# Patient Record
Sex: Female | Born: 1970 | Race: Black or African American | Hispanic: No | Marital: Single | Smoking: Current every day smoker
Health system: Southern US, Community
[De-identification: ages and names within clinical notes are randomized; demographics above are authoritative.]

## PROBLEM LIST (undated history)

## (undated) DIAGNOSIS — T7840XA Allergy, unspecified, initial encounter: Secondary | ICD-10-CM

## (undated) DIAGNOSIS — F17200 Nicotine dependence, unspecified, uncomplicated: Secondary | ICD-10-CM

## (undated) DIAGNOSIS — Z90710 Acquired absence of both cervix and uterus: Secondary | ICD-10-CM

## (undated) DIAGNOSIS — I10 Essential (primary) hypertension: Secondary | ICD-10-CM

## (undated) DIAGNOSIS — R569 Unspecified convulsions: Secondary | ICD-10-CM

## (undated) DIAGNOSIS — D649 Anemia, unspecified: Secondary | ICD-10-CM

## (undated) DIAGNOSIS — E119 Type 2 diabetes mellitus without complications: Secondary | ICD-10-CM

## (undated) HISTORY — DX: Essential (primary) hypertension: I10

## (undated) HISTORY — DX: Acquired absence of both cervix and uterus: Z90.710

## (undated) HISTORY — DX: Unspecified convulsions: R56.9

## (undated) HISTORY — DX: Allergy, unspecified, initial encounter: T78.40XA

## (undated) HISTORY — PX: ABDOMINAL HYSTERECTOMY: SHX81

## (undated) HISTORY — DX: Type 2 diabetes mellitus without complications: E11.9

## (undated) HISTORY — DX: Anemia, unspecified: D64.9

## (undated) HISTORY — DX: Nicotine dependence, unspecified, uncomplicated: F17.200

---

## 1997-10-14 ENCOUNTER — Emergency Department (HOSPITAL_COMMUNITY): Admission: EM | Admit: 1997-10-14 | Discharge: 1997-10-14 | Payer: Self-pay | Admitting: Emergency Medicine

## 1998-10-10 ENCOUNTER — Inpatient Hospital Stay (HOSPITAL_COMMUNITY): Admission: AD | Admit: 1998-10-10 | Discharge: 1998-10-10 | Payer: Self-pay | Admitting: Obstetrics

## 1999-01-23 ENCOUNTER — Emergency Department (HOSPITAL_COMMUNITY): Admission: EM | Admit: 1999-01-23 | Discharge: 1999-01-23 | Payer: Self-pay | Admitting: Emergency Medicine

## 2000-01-07 ENCOUNTER — Emergency Department (HOSPITAL_COMMUNITY): Admission: EM | Admit: 2000-01-07 | Discharge: 2000-01-07 | Payer: Self-pay | Admitting: Emergency Medicine

## 2000-01-10 ENCOUNTER — Encounter (INDEPENDENT_AMBULATORY_CARE_PROVIDER_SITE_OTHER): Payer: Self-pay | Admitting: Specialist

## 2000-01-10 ENCOUNTER — Ambulatory Visit (HOSPITAL_COMMUNITY): Admission: RE | Admit: 2000-01-10 | Discharge: 2000-01-10 | Payer: Self-pay | Admitting: General Surgery

## 2002-01-27 ENCOUNTER — Emergency Department (HOSPITAL_COMMUNITY): Admission: EM | Admit: 2002-01-27 | Discharge: 2002-01-27 | Payer: Self-pay | Admitting: Emergency Medicine

## 2003-02-27 ENCOUNTER — Emergency Department (HOSPITAL_COMMUNITY): Admission: EM | Admit: 2003-02-27 | Discharge: 2003-02-27 | Payer: Self-pay | Admitting: *Deleted

## 2006-12-03 ENCOUNTER — Emergency Department (HOSPITAL_COMMUNITY): Admission: EM | Admit: 2006-12-03 | Discharge: 2006-12-03 | Payer: Self-pay | Admitting: Emergency Medicine

## 2006-12-28 ENCOUNTER — Ambulatory Visit: Payer: Self-pay | Admitting: Oncology

## 2007-02-15 ENCOUNTER — Ambulatory Visit: Payer: Self-pay | Admitting: Oncology

## 2007-02-17 LAB — CBC WITH DIFFERENTIAL/PLATELET
Eosinophils Absolute: 0.2 10*3/uL (ref 0.0–0.5)
HCT: 29.3 % — ABNORMAL LOW (ref 34.8–46.6)
LYMPH%: 34.1 % (ref 14.0–48.0)
MCH: 20.3 pg — ABNORMAL LOW (ref 26.0–34.0)
MONO%: 4.9 % (ref 0.0–13.0)
RBC: 4.12 10*6/uL (ref 3.70–5.32)
lymph#: 2.3 10*3/uL (ref 0.9–3.3)

## 2007-02-19 LAB — HEMOGLOBINOPATHY EVALUATION
Hemoglobin Other: 0 % (ref 0.0–0.0)
Hgb F Quant: 0 % (ref 0.0–2.0)
Hgb S Quant: 0 % (ref 0.0–0.0)

## 2007-02-19 LAB — COMPREHENSIVE METABOLIC PANEL
ALT: 8 U/L (ref 0–35)
AST: 14 U/L (ref 0–37)
Albumin: 4.2 g/dL (ref 3.5–5.2)
Creatinine, Ser: 0.84 mg/dL (ref 0.40–1.20)
Potassium: 4.6 mEq/L (ref 3.5–5.3)

## 2007-02-19 LAB — IRON AND TIBC: %SAT: 4 % — ABNORMAL LOW (ref 20–55)

## 2007-02-19 LAB — FERRITIN: Ferritin: <1 ng/mL — ABNORMAL LOW (ref 10–291)

## 2007-03-16 LAB — CBC WITH DIFFERENTIAL/PLATELET
BASO%: 1.4 % (ref 0.0–2.0)
EOS%: 2.6 % (ref 0.0–7.0)
HCT: 36.6 % (ref 34.8–46.6)
HGB: 11.5 g/dL — ABNORMAL LOW (ref 11.6–15.9)
LYMPH%: 44.8 % (ref 14.0–48.0)
MCHC: 31.5 g/dL — ABNORMAL LOW (ref 32.0–36.0)
MONO%: 4.6 % (ref 0.0–13.0)
NEUT#: 2.9 10*3/uL (ref 1.5–6.5)
NEUT%: 46.7 % (ref 39.6–76.8)
Platelets: 343 10*3/uL (ref 145–400)
RDW: 26.5 % — ABNORMAL HIGH (ref 11.3–14.5)
lymph#: 2.8 10*3/uL (ref 0.9–3.3)

## 2007-03-16 LAB — IRON AND TIBC
%SAT: 23 % (ref 20–55)
Iron: 77 ug/dL (ref 42–145)
UIBC: 262 ug/dL

## 2007-03-16 LAB — FERRITIN: Ferritin: 203 ng/mL (ref 10–291)

## 2007-05-18 ENCOUNTER — Encounter (INDEPENDENT_AMBULATORY_CARE_PROVIDER_SITE_OTHER): Payer: Self-pay | Admitting: Obstetrics and Gynecology

## 2007-05-18 ENCOUNTER — Inpatient Hospital Stay (HOSPITAL_COMMUNITY): Admission: RE | Admit: 2007-05-18 | Discharge: 2007-05-20 | Payer: Self-pay | Admitting: Obstetrics and Gynecology

## 2007-06-14 ENCOUNTER — Ambulatory Visit: Payer: Self-pay | Admitting: Oncology

## 2009-08-02 ENCOUNTER — Encounter: Payer: Self-pay | Admitting: Internal Medicine

## 2009-08-02 LAB — CONVERTED CEMR LAB

## 2009-10-10 ENCOUNTER — Encounter: Payer: Self-pay | Admitting: Internal Medicine

## 2009-10-10 LAB — CONVERTED CEMR LAB
Glucose, Urine, Semiquant: 119
TSH: 2.164 microintl units/mL

## 2009-10-16 ENCOUNTER — Encounter: Payer: Self-pay | Admitting: Internal Medicine

## 2009-12-20 ENCOUNTER — Encounter: Payer: Self-pay | Admitting: Internal Medicine

## 2009-12-20 ENCOUNTER — Encounter: Admission: RE | Admit: 2009-12-20 | Discharge: 2009-12-20 | Payer: Self-pay | Admitting: Obstetrics and Gynecology

## 2009-12-27 ENCOUNTER — Encounter: Payer: Self-pay | Admitting: Internal Medicine

## 2010-01-29 ENCOUNTER — Ambulatory Visit: Payer: Self-pay | Admitting: Internal Medicine

## 2010-01-29 DIAGNOSIS — R569 Unspecified convulsions: Secondary | ICD-10-CM

## 2010-01-29 DIAGNOSIS — D509 Iron deficiency anemia, unspecified: Secondary | ICD-10-CM

## 2010-01-29 DIAGNOSIS — E785 Hyperlipidemia, unspecified: Secondary | ICD-10-CM | POA: Insufficient documentation

## 2010-01-29 DIAGNOSIS — G40909 Epilepsy, unspecified, not intractable, without status epilepticus: Secondary | ICD-10-CM | POA: Insufficient documentation

## 2010-01-29 DIAGNOSIS — E119 Type 2 diabetes mellitus without complications: Secondary | ICD-10-CM

## 2010-01-29 DIAGNOSIS — I10 Essential (primary) hypertension: Secondary | ICD-10-CM | POA: Insufficient documentation

## 2010-01-29 DIAGNOSIS — N760 Acute vaginitis: Secondary | ICD-10-CM | POA: Insufficient documentation

## 2010-01-29 DIAGNOSIS — F17200 Nicotine dependence, unspecified, uncomplicated: Secondary | ICD-10-CM

## 2010-01-29 DIAGNOSIS — E0843 Diabetes mellitus due to underlying condition with diabetic autonomic (poly)neuropathy: Secondary | ICD-10-CM | POA: Insufficient documentation

## 2010-01-29 DIAGNOSIS — J309 Allergic rhinitis, unspecified: Secondary | ICD-10-CM | POA: Insufficient documentation

## 2010-01-29 LAB — CONVERTED CEMR LAB
AST: 15 units/L (ref 0–37)
Albumin: 4.1 g/dL (ref 3.5–5.2)
Alkaline Phosphatase: 60 units/L (ref 39–117)
Basophils Relative: 0.4 % (ref 0.0–3.0)
Bilirubin, Direct: 0.1 mg/dL (ref 0.0–0.3)
CO2: 26 meq/L (ref 19–32)
Calcium: 9.1 mg/dL (ref 8.4–10.5)
Chloride: 106 meq/L (ref 96–112)
Cholesterol: 200 mg/dL (ref 0–200)
Creatinine, Ser: 0.9 mg/dL (ref 0.4–1.2)
HCT: 40.7 % (ref 36.0–46.0)
Hemoglobin: 13.7 g/dL (ref 12.0–15.0)
Hgb A1c MFr Bld: 6.5 % (ref 4.6–6.5)
Iron: 113 ug/dL (ref 42–145)
Lymphs Abs: 2.6 10*3/uL (ref 0.7–4.0)
Monocytes Absolute: 0.3 10*3/uL (ref 0.1–1.0)
Neutro Abs: 3 10*3/uL (ref 1.4–7.7)
Neutrophils Relative %: 49.3 % (ref 43.0–77.0)
Platelets: 208 10*3/uL (ref 150.0–400.0)
RBC: 4.26 M/uL (ref 3.87–5.11)
RDW: 13.4 % (ref 11.5–14.6)
Saturation Ratios: 31.7 % (ref 20.0–50.0)
Total Bilirubin: 0.5 mg/dL (ref 0.3–1.2)
Total Protein: 7.2 g/dL (ref 6.0–8.3)
Transferrin: 254.5 mg/dL (ref 212.0–360.0)
Triglycerides: 113 mg/dL (ref 0.0–149.0)
VLDL: 22.6 mg/dL (ref 0.0–40.0)

## 2010-01-30 ENCOUNTER — Encounter: Payer: Self-pay | Admitting: Internal Medicine

## 2010-04-08 ENCOUNTER — Telehealth: Payer: Self-pay | Admitting: Internal Medicine

## 2010-07-09 NOTE — Letter (Signed)
Summary: [facility]   Imported By: Lester Belview 12/28/2009 10:14:01  _____________________________________________________________________  External Attachment:    Type:   Image     Comment:   External Document

## 2010-07-09 NOTE — Letter (Signed)
Summary: Lipid Letter  [facility]  [address] [place], [place] [address]   Phone: [phone]  Fax: [phone]    01/30/2010  Sharon Bishop [address] [place], [place]  [address]  Dear Sharon Bishop:  We have carefully reviewed your last lipid profile from 01/29/2010 and the results are noted below with a summary of recommendations for lipid management.    Cholesterol:       200     Goal: <200   HDL "good" Cholesterol:   29.51     Goal: >50   LDL "bad" Cholesterol:   128     Goal: <100   Triglycerides:       113.0     Goal: <150    blood sugars are acceptable and other labs look good    TLC Diet (Therapeutic Lifestyle Change): Saturated Fats & Transfatty acids should be kept < 7% of total calories ***Reduce Saturated Fats Polyunstaurated Fat can be up to 10% of total calories Monounsaturated Fat Fat can be up to 20% of total calories Total Fat should be no greater than 25-35% of total calories Carbohydrates should be 50-60% of total calories Protein should be approximately 15% of total calories Fiber should be at least 20-30 grams a day ***Increased fiber may help lower LDL Total Cholesterol should be < 200mg /day Consider adding plant stanol/sterols to diet (example: Benacol spread) ***A higher intake of unsaturated fat may reduce Triglycerides and Increase HDL    Adjunctive Measures (may lower LIPIDS and reduce risk of Heart Attack) include: Aerobic Exercise (20-30 minutes 3-4 times a week) Limit Alcohol Consumption Weight Reduction Aspirin 75-81 mg a day by mouth (if not allergic or contraindicated) Dietary Fiber 20-30 grams a day by mouth     Current Medications: 1)    Penicillin V Potassium 500 Mg Tabs (Penicillin v potassium) .... As directed 2)    Hydrocodone-acetaminophen 7.5-750 Mg Tabs (Hydrocodone-acetaminophen) .... Take 1 tablet by mouth four times a day 3)    Ery-tab 333 Mg Tbec (Erythromycin base) .... As directed 4)    Fluconazole 150  Mg Tabs (Fluconazole) .... One by mouth for yeast infection  If you have any questions, please call. We appreciate being able to work with you.   Sincerely,     [facility] Etta Grandchild MD

## 2010-07-09 NOTE — Assessment & Plan Note (Signed)
Summary: new / uhc / # / cd   Vital Signs:  Patient profile:   40 year old female Menstrual status:  hysterectomy Height:      61 inches Weight:      141 pounds BMI:     26.74 O2 Sat:      98 % on Room air Temp:     98.3 degrees F oral Pulse rate:   71 / minute Pulse rhythm:   regular Resp:     16 per minute BP sitting:   130 / 80  (left arm) Cuff size:   large  Vitals Entered By: Rock Nephew CMA (January 29, 2010 10:43 AM)  Nutrition Counseling: Patient's BMI is greater than 25 and therefore counseled on weight management options.  O2 Flow:  Room air  Primary Care Karenann Mcgrory:  Yetta Barre   History of Present Illness: New to me- she has been taking antibiotics for a broken tooth and feels like she has a vag. yeast infection.  Preventive Screening-Counseling & Management  Alcohol-Tobacco     Alcohol drinks/day: 0     Smoking Status: current     Smoking Cessation Counseling: yes     Smoke Cessation Stage: precontemplative     Packs/Day: 0.25     Year Started: 1995     Pack years: 10     Tobacco Counseling: to quit use of tobacco products  Caffeine-Diet-Exercise     Caffeine use/day: 2-3 drinks daily     Does Patient Exercise: yes     Exercise Counseling: not indicated; exercise is adequate  Hep-HIV-STD-Contraception     Hepatitis Risk: no risk noted     HIV Risk: no risk noted     STD Risk: no risk noted     SBE monthly: yes  Safety-Violence-Falls     Seat Belt Use: yes     Helmet Use: yes     Firearms in the Home: no firearms in the home     Smoke Detectors: yes     Violence in the Home: no risk noted      Sexual History:  currently monogamous.        Drug Use:  no.        Blood Transfusions:  no.    Clinical Review Panels:  Lipid Management   Cholesterol:  198 (10/10/2009)   LDL (bad choesterol):  129 (10/10/2009)   HDL (good cholesterol):  44 (10/10/2009)   Triglycerides:  126 (10/10/2009)   Medications Prior to Update: 1)  None  Current  Medications (verified): 1)  Penicillin V Potassium 500 Mg Tabs (Penicillin V Potassium) .... As Directed 2)  Hydrocodone-Acetaminophen 7.5-750 Mg Tabs (Hydrocodone-Acetaminophen) .... Take 1 Tablet By Mouth Four Times A Day 3)  Ery-Tab 333 Mg Tbec (Erythromycin Base) .... As Directed 4)  Fluconazole 150 Mg Tabs (Fluconazole) .... One By Mouth For Yeast Infection  Allergies (verified): No Known Drug Allergies  Comments:  Nurse/Medical Assistant: The patient's medications were reviewed with the patient's parent and were updated in the Medication and Allergy Lists. Rock Nephew CMA (January 29, 2010 10:49 AM)  Past History:  Past Medical History: Allergic rhinitis Anemia-iron deficiency Hyperlipidemia Hypertension Seizure disorder Diabetes mellitus, type II  Family History: Family History Diabetes 1st degree relative Family History Hypertension  Social History: Occupation: Sports coach for hospital patients Single Current Smoker Alcohol use-no Drug use-no Regular exercise-yes Education:  Automotive engineer Does Patient Exercise:  yes Seat Belt Use:  yes Alcohol:  Less than 3 drinks per week  Caffeine use/day:  2-3 drinks daily Smoking Status:  current Packs/Day:  0.25 Hepatitis Risk:  no risk noted HIV Risk:  no risk noted STD Risk:  no risk noted Sexual History:  currently monogamous Blood Transfusions:  no Drug Use:  no  Review of Systems  The patient denies anorexia, fever, weight loss, weight gain, chest pain, syncope, dyspnea on exertion, peripheral edema, prolonged cough, headaches, hemoptysis, abdominal pain, hematuria, genital sores, suspicious skin lesions, angioedema, and breast masses.   GU:  Complains of discharge; denies abnormal vaginal bleeding, dysuria, hematuria, incontinence, nocturia, urinary frequency, and urinary hesitancy. Heme:  Denies abnormal bruising, bleeding, enlarge lymph nodes, fevers, pallor, and skin discoloration.  Physical Exam  General:   alert, well-developed, well-nourished, well-hydrated, appropriate dress, normal appearance, healthy-appearing, cooperative to examination, good hygiene, and overweight-appearing.   Head:  normocephalic, atraumatic, no abnormalities observed, and no abnormalities palpated.   Mouth:  Oral mucosa and oropharynx without lesions or exudates.  Teeth in good repair. Neck:  supple, full ROM, no masses, no thyromegaly, no thyroid nodules or tenderness, no JVD, normal carotid upstroke, no carotid bruits, no cervical lymphadenopathy, and no neck tenderness.   Lungs:  normal respiratory effort, no intercostal retractions, no accessory muscle use, normal breath sounds, no dullness, no fremitus, no crackles, and no wheezes.   Heart:  normal rate, regular rhythm, no murmur, no gallop, no rub, and no JVD.   Abdomen:  soft, non-tender, normal bowel sounds, no distention, no masses, no guarding, no rigidity, no rebound tenderness, no abdominal hernia, no inguinal hernia, no hepatomegaly, and no splenomegaly.   Msk:  normal ROM, no joint tenderness, no joint swelling, no joint warmth, no redness over joints, no joint deformities, no joint instability, and no crepitation.   Pulses:  R and L carotid,radial,femoral,dorsalis pedis and posterior tibial pulses are full and equal bilaterally Extremities:  No clubbing, cyanosis, edema, or deformity noted with normal full range of motion of all joints.   Neurologic:  No cranial nerve deficits noted. Station and gait are normal. Plantar reflexes are down-going bilaterally. DTRs are symmetrical throughout. Sensory, motor and coordinative functions appear intact. Skin:  turgor normal, color normal, no rashes, no suspicious lesions, no ecchymoses, no petechiae, no purpura, no ulcerations, no edema, and tattoo(s).   Cervical Nodes:  no anterior cervical adenopathy and no posterior cervical adenopathy.   Axillary Nodes:  no R axillary adenopathy and no L axillary adenopathy.   Inguinal  Nodes:  no R inguinal adenopathy and no L inguinal adenopathy.   Psych:  Cognition and judgment appear intact. Alert and cooperative with normal attention span and concentration. No apparent delusions, illusions, hallucinations   Impression & Recommendations:  Problem # 1:  VAGINITIS (ICD-616.10) Assessment New sounds like yeast so will treat with fluconazole Her updated medication list for this problem includes:    Penicillin V Potassium 500 Mg Tabs (Penicillin v potassium) .Marland Kitchen... As directed    Ery-tab 333 Mg Tbec (Erythromycin base) .Marland Kitchen... As directed  Problem # 2:  HYPERTENSION (ICD-401.9) Assessment: Unchanged  Orders: Venipuncture ([address]) TLB-BMP (Basic Metabolic Panel-BMET) (80048-METABOL) TLB-CBC Platelet - w/Differential (85025-CBCD) TLB-Hepatic/Liver Function Pnl (80076-HEPATIC) TLB-TSH (Thyroid Stimulating Hormone) (84443-TSH) TLB-IBC Pnl (Iron/FE;Transferrin) (83550-IBC) TLB-Lipid Panel (80061-LIPID) Tobacco use cessation intermediate 3-10 minutes (99406)  BP today: 130/80  Labs Reviewed: Chol: 198 (10/10/2009)   HDL: 44 (10/10/2009)   LDL: 129 (10/10/2009)   TG: 126 (10/10/2009)  Problem # 3:  ANEMIA-IRON DEFICIENCY (ICD-280.9) Assessment: Unchanged  Orders: Venipuncture ([address]) TLB-BMP (Basic Metabolic  Panel-BMET) (80048-METABOL) TLB-CBC Platelet - w/Differential (85025-CBCD) TLB-Hepatic/Liver Function Pnl (80076-HEPATIC) TLB-TSH (Thyroid Stimulating Hormone) (84443-TSH) TLB-IBC Pnl (Iron/FE;Transferrin) (83550-IBC) TLB-Lipid Panel (80061-LIPID)  TSH: 2.164 (10/10/2009)  Problem # 4:  DIABETES MELLITUS, TYPE II (ICD-250.00) Assessment: Unchanged  Orders: TLB-A1C / Hgb A1C (Glycohemoglobin) (83036-A1C)  Problem # 5:  TOBACCO USE (ICD-305.1) Assessment: Unchanged  Encouraged smoking cessation and discussed different methods for smoking cessation.   Orders: Tobacco use cessation intermediate 3-10 minutes ([address])  Complete Medication List: 1)   Penicillin V Potassium 500 Mg Tabs (Penicillin v potassium) .... As directed 2)  Hydrocodone-acetaminophen 7.5-750 Mg Tabs (Hydrocodone-acetaminophen) .... Take 1 tablet by mouth four times a day 3)  Ery-tab 333 Mg Tbec (Erythromycin base) .... As directed 4)  Fluconazole 150 Mg Tabs (Fluconazole) .... One by mouth for yeast infection  Patient Instructions: 1)  Please schedule a follow-up appointment in 2 months. 2)  Tobacco is very bad for your health and your loved ones! You Should stop smoking!. 3)  Stop Smoking Tips: Choose a Quit date. Cut down before the Quit date. decide what you will do as a substitute when you feel the urge to smoke(gum,toothpick,exercise). 4)  It is important that you exercise regularly at least 20 minutes 5 times a week. If you develop chest pain, have severe difficulty breathing, or feel very tired , stop exercising immediately and seek medical attention. 5)  If you could be exposed to sexually transmitted diseases, you should use a condom. 6)  Check your blood sugars regularly. If your readings are usually above 200 or below 70 you should contact our office. 7)  It is important that your Diabetic A1c level is checked every 3 months. 8)  See your eye doctor yearly to check for diabetic eye damage. 9)  Check your feet each night for sore areas, calluses or signs of infection. 10)  Check your Blood Pressure regularly. If it is above 130/80: you should make an appointment. Prescriptions: FLUCONAZOLE 150 MG TABS (FLUCONAZOLE) One by mouth for yeast infection  #1 x 3   Entered and Authorized by:   Etta Grandchild MD   Signed by:   Etta Grandchild MD on 01/29/2010   Method used:   Print then Give to Patient   RxID:   [id number]

## 2010-07-09 NOTE — Progress Notes (Signed)
Summary: request for rx  Phone Note Call from Patient   Caller: [phone]  Summary of Call: Patient called requesting an rx for Chantix, states that she has decided to take it. Please advise Initial call taken by: Rock Nephew CMA,  April 08, 2010 5:05 PM  Follow-up for Phone Call        is there any chance she is pregnant? Follow-up by: Etta Grandchild MD,  April 09, 2010 8:07 AM  Additional Follow-up for Phone Call Additional follow up Details #1::        Return call to pt/lmovm need to know if any chance she is pregnant and what pharmacy she prefers.Marland KitchenMarland KitchenMarland KitchenAlvy Beal Archie CMA  April 09, 2010 8:42 AM     Additional Follow-up for Phone Call Additional follow up Details #2::    pt has had a hysterectomy, pt uses [facility] [address] Follow-up by: Ami Bullins CMA,  April 09, 2010 8:45 AM  New/Updated Medications: CHANTIX STARTING MONTH PAK 0.5 MG X 11 & 1 MG X 42 TABS (VARENICLINE TARTRATE) take as directed CHANTIX CONTINUING MONTH PAK 1 MG TABS (VARENICLINE TARTRATE) One by mouth two times a day Prescriptions: CHANTIX CONTINUING MONTH PAK 1 MG TABS (VARENICLINE TARTRATE) One by mouth two times a day  #60 x 3   Entered and Authorized by:   Etta Grandchild MD   Signed by:   Etta Grandchild MD on 04/09/2010   Method used:   Electronically to        [facility] [address] [phone]* (retail)       [address]       [place], [place]  [address]       Ph: [phone] or [phone]       Fax: [phone]   RxID:   [id number] CHANTIX STARTING MONTH PAK 0.5 MG X 11 & 1 MG X 42 TABS (VARENICLINE TARTRATE) take as directed  #1 pak x 0   Entered and Authorized by:   Etta Grandchild MD   Signed by:   Etta Grandchild MD on 04/09/2010   Method used:   Electronically to        [facility] [address] [phone]* (retail)       [address]       [place], [place]  [address]       Ph: [phone] or [phone]       Fax: [phone]  RxID:   [id number]

## 2010-08-23 ENCOUNTER — Telehealth: Payer: Self-pay | Admitting: Internal Medicine

## 2010-08-27 ENCOUNTER — Telehealth: Payer: Self-pay | Admitting: *Deleted

## 2010-08-27 DIAGNOSIS — J3089 Other allergic rhinitis: Secondary | ICD-10-CM

## 2010-08-27 NOTE — Telephone Encounter (Signed)
Pt req refill of R-Tanna tabs 1 qd - this is a decongestant. Please advise.

## 2010-08-27 NOTE — Progress Notes (Signed)
Summary: refill  Phone Note From Pharmacy   Caller: [facility] [address] Summary of Call: Received fax requesting rx refill on R-Tanna tab Take 1 tablet by mouth once a day . I did not see this med in EMR, please adviseif ok to refill? Initial call taken by: Rock Nephew CMA,  August 23, 2010 10:26 AM  Follow-up for Phone Call        i don't know what this is Follow-up by: Etta Grandchild MD,  August 23, 2010 10:27 AM  Additional Follow-up for Phone Call Additional follow up Details #1::        DEnied and pharmacy notified via fax Additional Follow-up by: Rock Nephew CMA,  August 23, 2010 10:52 AM

## 2010-08-28 MED ORDER — CHLORPHEN TAN-PYRIL TAN-PE TAN 2-12.5-5 MG/5ML PO SUSP: 5.0000 mL | Freq: Two times a day (BID) | ORAL | Status: DC | PRN

## 2010-08-28 NOTE — Telephone Encounter (Signed)
Left mess for pt to check w/her pharm

## 2010-08-28 NOTE — Telephone Encounter (Signed)
done

## 2010-08-29 ENCOUNTER — Telehealth: Payer: Self-pay | Admitting: Internal Medicine

## 2010-08-29 NOTE — Telephone Encounter (Signed)
What?

## 2010-08-29 NOTE — Telephone Encounter (Signed)
Disregard-this was refilled under R-Tanna tabs 08/27/10.

## 2010-08-29 NOTE — Telephone Encounter (Signed)
Chlortan.Please clarify which drug this is so we can dispense

## 2010-10-22 NOTE — Discharge Summary (Signed)
NAMEPIEDAD, STANDIFORD                ACCOUNT NO.:  [id number]   MEDICAL RECORD NO.:  [id number]          PATIENT TYPE:  INP   LOCATION:  9315                          FACILITY:  [facility]   PHYSICIAN:  Juluis Mire, M.D.   DATE OF BIRTH:  Apr 18, 1971   DATE OF ADMISSION:  05/18/2007  DATE OF DISCHARGE:  05/20/2007                               DISCHARGE SUMMARY   ADMITTING DIAGNOSIS:  Uterine fibroids.   DISCHARGE DIAGNOSIS:  Uterine fibroids.   PROCEDURE:  Total abdominal hysterectomy.   HISTORY OF PRESENT ILLNESS:  For complete history and physical, please  see dictated note.   COURSE IN THE HOSPITAL:  The patient underwent the above-noted surgery,  pathology pending.  Postop, she did well.  Postop hemoglobin was 10.3.  She was discharged home on her 2nd postop day.  At that time, she was  tolerating her diet and ambulating without difficulty.  She was also  voiding without difficulty.  She was completely afebrile with stable  vital signs.  The low transverse incision was intact.  Abdomen was soft.  Bowel sounds were active.  She had not passed flatus.  She had had no  active bleeding.  In terms of complication, none were encountered during her stay in  hospital.  The patient was discharged home in stable condition.   DISPOSITION:  Routine postop instructions were given.  She is to avoid  heavy lifting, vaginal entrance or driving a car.  She is to watch for  signs of infection in terms of fever.  She will watch for increasing  nausea or vomiting.  She will report active vaginal bleeding, also  report increasing abdominal or back discomfort.  Signs and symptoms of  deep venous thrombosis and pulmonary embolus have been emphasized.   MEDICATIONS:  Tylox as needed for pain.   PLAN:  Follow up in the office in 1 week.      Juluis Mire, M.D.  Electronically Signed     JSM/MEDQ  D:  05/20/2007  T:  05/20/2007  Job:  [id number]

## 2010-10-22 NOTE — H&P (Signed)
NAMEREEDA, SOOHOO NO.:  [id number]   MEDICAL RECORD NO.:  [id number]          PATIENT TYPE:  INP   LOCATION:  9315                          FACILITY:  [facility]   PHYSICIAN:  Juluis Mire, M.D.   DATE OF BIRTH:  1970/09/26   DATE OF ADMISSION:  05/18/2007  DATE OF DISCHARGE:                              HISTORY & PHYSICAL   The patient is a 40 year old mild gravida, single black female who  presents for a total abdominal hysterectomy.   RELATION TO PRESENT ADMISSION:  The patient has been followed with  enlarging uterine fibroids and associated menorrhagia.  She has had  hemoglobins as low as in the 6 range.  She has been given iron  infusions.  The plan was to proceed with hysterectomy.  She presented to  our practice for consultation and evaluation in the office.  She had 20  week-size uterine fibroids on exam.  In view of this, we discussed  various options.  She was not interested in future childbearing.  Of  note, her partner has had a previous vasectomy.  We had discussed  possible myomectomy versus radiological embolization.  At the present  time, the patient proceeded with definitive therapy is admitted for  total abdominal hysterectomy.  Ovaries will be left in place, if normal.   She did have an ultrasound performed in the office was saline infusion  ultrasound.  Endometrium was unremarkable.  There was no polyps, and  again ultrasound confirms multiple large fibroids, consistent with the  exam.  She had a complex cyst on the left ovary measuring 3 cm.   ALLERGIES:  NO KNOWN DRUG ALLERGIES.   MEDICATION:  Include ibuprofen.   PAST MEDICAL HISTORY:  Usual childhood disease without any significant  sequelae.  No previous surgical or obstetrical history.   FAMILY HISTORY:  There is a history of diabetes, hypertension and heart  disease.   SOCIAL HISTORY:  Does reveal 5 to 6 cigarettes per day.   REVIEW OF SYSTEMS:  Noncontributory.   PHYSICAL  EXAM:  GENERAL:  The patient is afebrile, stable vital signs.  HEENT:  The patient is normocephalic.  Pupils equal and reactive to  light and accommodation.  Extraocular were intact.  Sclerae and  conjunctivae clear.  Oropharynx clear.  NECK:  Without thyromegaly.  BREASTS:  Not examined.  LUNGS:  Clear.  CARDIOVASCULAR:  Regular rhythm rate without murmurs or gallops.  ABDOMEN:  Exam reveals enlarging fibroids up to the umbilicus.  No mass,  organomegaly.  PELVIC:  Normal external genitalia.  Vaginal mucosa clear.  Cervix  unremarkable.  Uterus is approximately 20 weeks in size.  Adnexa  difficult to evaluate.  EXTREMITIES:  Trace edema.  NEUROLOGIC:  Grossly within normal limits.   IMPRESSION:  Enlarging uterine fibroids with associated menorrhagia and  anemia.   PLAN:  The patient to undergo total abdominal hysterectomy.  The risks  of surgery have been discussed, including with the risk of infection.  Risk of hemorrhage that could require transfusion with the risk of AIDS  or hepatitis.  Risk of injury  to adjacent organs including bladder,  bowel, ureters that could require further exploratory surgery.  Risk of  deep venous thrombosis and pulmonary embolus.  The patient does  understand she will lose the ability have children in the future.  She  does also understand the other alternatives.      Juluis Mire, M.D.  Electronically Signed     JSM/MEDQ  D:  05/18/2007  T:  05/18/2007  Job:  [id number]

## 2010-10-22 NOTE — Op Note (Signed)
Sharon Bishop, Sharon Bishop                ACCOUNT NO.:  [id number]   MEDICAL RECORD NO.:  [id number]          PATIENT TYPE:  INP   LOCATION:  9315                          FACILITY:  [facility]   PHYSICIAN:  Juluis Mire, M.D.   DATE OF BIRTH:  1971-02-07   DATE OF PROCEDURE:  05/18/2007  DATE OF DISCHARGE:                               OPERATIVE REPORT   PREOPERATIVE DIAGNOSIS:  Uterine fibroid.   POSTOPERATIVE DIAGNOSIS:  Uterine fibroid.   PROCEDURE:  Total abdominal hysterectomy.   ASSISTANT:  Duke Salvia. Marcelle Overlie, M.D.   ANESTHESIA:  General endotracheal.   ESTIMATED BLOOD LOSS:  300 mL.   PACKS AND DRAINS:  None.   INTRAOPERATIVE BLOOD REPLACEMENT:  None.   COMPLICATIONS:  None.   INDICATIONS:  Noted in the history and physical.   PROCEDURE IN DETAIL:  The patient was taken to the OR and placed in  supine position.  After satisfactory level of general endotracheal  anesthesia was obtained, the abdomen, perineum and vagina were prepped  with Betadine and draped in a sterile field.   A low transverse skin incision was made with a knife and carried through  subcutaneous tissue.  The fascia was identified and entered sharply and  extended the fascia laterally.  The fascia was taken off the muscle  superiorly and inferiorly.  The rectus muscles were separated in the  midline.  The peritoneum was entered sharply and incision of perineum  extended both superiorly and inferiorly.  The uterus was enlarged with  multiple uterine fibroids.  The uterus was delivered through the  incision.  It did have adhesions to both ovaries and the pelvic  sidewall.  These were taken down.  At this point in time, the right  round ligament was clamped, cut and suture ligated with 0 Vicryl.  The  bladder flap was then developed.  The right uteroovarian pedicle was  isolated, clamped and cut and doubly ligated first with a free tie of 0  Vicryl and then a suture ligature of 0 Vicryl.  The uterine vessels  were  skeletonized, clamped, cut and suture ligated with 0 Vicryl.  We then  went to the left side.  The left round ligament was clamped, cut and  suture ligated with 0 Vicryl.  We continued to develop the bladder flap  anteriorly.  The bladder was dissected off the lower uterine segment.  Next, the left uteroovarian pedicle was isolated, clamped, cut and  doubly ligated first with a free tie of 0 Vicryl and then a suture  ligature of 0 Vicryl.  The left uterine vessels were skeletonized,  clamped, cut and suture ligated with 0 Vicryl.  Using the clamp, cut and  tighten technique with sutures ligature of 0 Vicryl, the perimetrium was  serially separated from the sides of the uterus.  Both uterosacral  ligaments were then clamped, cut and suture ligated with 0 Vicryl.  The  vaginal angles were then clamped and cut.  The remaining vaginal mucosa  was excised.  The uterus was and cervix were passed off the operative  field and sent  to Pathology.   The vaginal angles were repaired with suture ligatures of 0 Vicryl.  The  remaining vaginal mucosa was closed with an interrupted figure-of-eight  of 0 Vicryl.  Some oozing was noted from the bladder flap and brought  under control with the Bovie.  She did have some slightly tinged urine  at this point.  We went ahead and filled the bladder with irrigation and  methylene blue.  There was no evidence of any bladder perforation.  The  Foley was then unclamped.  We visualized both ovaries.  There was some  oozing from both sides and brought under control with suture ligatures  of 0 Vicryl.  At this point in time, we thoroughly irrigated the pelvic  cavity.  We had good hemostasis at both ovaries and at the vaginal cuff.  We did see the appendix and it was normal.  Palpation of the upper  abdomen including liver and both kidneys was unremarkable.   At this point in time, the peritoneum was closed with a running suture  of 3-0 Vicryl.  The fascia was  closed with running suture of 0 PDS.  The  skin was closed with staples and Steri-Strips.  The urine was clear at  this point in time.  Sponge, instrument and needle counts reported as  correct by surgical nurse x2.   The patient tolerated the procedure well and was returned to recovery  room in good condition.      Juluis Mire, M.D.  Electronically Signed     JSM/MEDQ  D:  05/18/2007  T:  05/18/2007  Job:  [id number]

## 2010-10-25 NOTE — Op Note (Signed)
[place]  Patient:    BRIAHNNA, HARRIES                       MRN: [id number] Proc. Date: 01/10/00 Adm. Date:  [id number] Disc. Date: [id number] Attending:  Brandy Hale                           Operative Report  PREOPERATIVE DIAGNOSIS:  Recurrent pilonidal abscess.  POSTOPERATIVE DIAGNOSIS:  Recurrent pilonidal abscess.  OPERATION: 1. Incision and drainage of recurrent pilonidal abscess. 2. Debridement of skin and subcutaneous tissue.  SURGEON:  Dr. Claud Kelp.  OPERATIVE INDICATIONS:  This is a 40 year old black female who has had three of four episodes of swelling and pain in the pilonidal area over the past 2-3 years.  These have always spontaneously drained.  She was sent to me yesterday because of large painful swelling in the pilonidal area.  She had about a 5 cm abscess in the presacral area.  She did not want to have this drained in the office, and she was brought to the operating room early this morning for drainage of her pilonidal abscess.  DESCRIPTION OF PROCEDURE:  Following the induction of general endotracheal anesthesia, the patient was placed supine position.  The buttock was taped apart.  Pilonidal area and presacral area was prepped and draped in a sterile fashion.  I found that she had an abscess approximately 6 cm or more in vertical dimension, and about 3 cm in transverse dimension in the typical pilonidal location in the midline.  There was a small skin dimple inferiorly.  I made an elliptical incision about 1 cm transversely for the entire length of the abscess, and drained the abscess.  I debrided skin and subcutaneous tissue that was inflamed and necrotic.  I debrided the skin and subcutaneous tissue back basically to help the bleeding subcutaneous tissue.  Hemostasis was very good and achieved with electrocautery.  The wound was irrigated numerous times.  Once I was satisfied with the debridement and  the hemostasis I packed the wound with saline moistened Kerlix and cover bandages.  The patient tolerated the procedure well, and was taken to the recovery room in stable condition.  ESTIMATED BLOOD LOSS:  About 20 cc to 30 cc.  COMPLICATIONS:  None.  Sponge, needle, and instrument counts were correct. DD:  01/10/00 TD:  01/12/00 Job: [id number]

## 2011-03-17 LAB — CBC
HCT: 30.3 — ABNORMAL LOW
MCHC: 33.7
MCHC: 33.9
MCV: 91.9
RBC: 4.16
RDW: 15.7 — ABNORMAL HIGH
RDW: 20.8 — ABNORMAL HIGH
WBC: 7.9

## 2011-03-17 LAB — HCG, SERUM, QUALITATIVE: Preg, Serum: NEGATIVE

## 2011-03-26 LAB — DIFFERENTIAL
Basophils Absolute: 0.1
Blasts: 0
Eosinophils Absolute: 0.1
Eosinophils Relative: 1
Lymphocytes Relative: 28
Metamyelocytes Relative: 0
Monocytes Relative: 3
Neutro Abs: 5.5
Neutrophils Relative %: 67
Promyelocytes Absolute: 0

## 2011-03-26 LAB — I-STAT 8, (EC8 V) (CONVERTED LAB)
BUN: 12
Bicarbonate: 25.3 — ABNORMAL HIGH
Chloride: 108
HCT: 28 — ABNORMAL LOW
TCO2: 27

## 2011-03-26 LAB — CBC
Hemoglobin: 6.7 — CL
MCHC: 28.3 — ABNORMAL LOW
RBC: 3.71 — ABNORMAL LOW

## 2011-06-18 ENCOUNTER — Other Ambulatory Visit: Payer: Self-pay | Admitting: Obstetrics and Gynecology

## 2011-06-18 DIAGNOSIS — N63 Unspecified lump in unspecified breast: Secondary | ICD-10-CM

## 2011-06-20 ENCOUNTER — Other Ambulatory Visit: Payer: Self-pay | Admitting: Obstetrics and Gynecology

## 2011-06-20 DIAGNOSIS — N63 Unspecified lump in unspecified breast: Secondary | ICD-10-CM

## 2011-11-07 ENCOUNTER — Ambulatory Visit
Admission: RE | Admit: 2011-11-07 | Discharge: 2011-11-07 | Disposition: A | Discharge status: Home / Self Care | Payer: 59 | Source: Ambulatory Visit | Attending: Obstetrics and Gynecology | Admitting: Obstetrics and Gynecology

## 2011-11-07 DIAGNOSIS — N63 Unspecified lump in unspecified breast: Secondary | ICD-10-CM

## 2012-06-09 HISTORY — PX: BREAST BIOPSY: SHX20

## 2012-09-30 ENCOUNTER — Other Ambulatory Visit: Payer: Self-pay | Admitting: Obstetrics and Gynecology

## 2013-01-14 ENCOUNTER — Other Ambulatory Visit: Payer: Self-pay | Admitting: Obstetrics and Gynecology

## 2013-01-14 DIAGNOSIS — N63 Unspecified lump in unspecified breast: Secondary | ICD-10-CM

## 2013-02-02 ENCOUNTER — Other Ambulatory Visit: Payer: Self-pay | Admitting: Obstetrics and Gynecology

## 2013-02-02 ENCOUNTER — Ambulatory Visit
Admission: RE | Admit: 2013-02-02 | Discharge: 2013-02-02 | Disposition: A | Discharge status: Home / Self Care | Payer: 59 | Source: Ambulatory Visit | Attending: Obstetrics and Gynecology | Admitting: Obstetrics and Gynecology

## 2013-02-02 DIAGNOSIS — N63 Unspecified lump in unspecified breast: Secondary | ICD-10-CM

## 2013-10-18 ENCOUNTER — Other Ambulatory Visit: Payer: Self-pay | Admitting: Obstetrics and Gynecology

## 2013-10-18 DIAGNOSIS — Z1231 Encounter for screening mammogram for malignant neoplasm of breast: Secondary | ICD-10-CM

## 2014-02-03 ENCOUNTER — Ambulatory Visit: Payer: 59

## 2014-02-20 ENCOUNTER — Ambulatory Visit
Admission: RE | Admit: 2014-02-20 | Discharge: 2014-02-20 | Disposition: A | Discharge status: Home / Self Care | Payer: 59 | Source: Ambulatory Visit | Attending: Obstetrics and Gynecology | Admitting: Obstetrics and Gynecology

## 2014-02-20 DIAGNOSIS — Z1231 Encounter for screening mammogram for malignant neoplasm of breast: Secondary | ICD-10-CM

## 2014-10-30 ENCOUNTER — Other Ambulatory Visit: Payer: Self-pay | Admitting: Obstetrics and Gynecology

## 2014-10-31 LAB — CYTOLOGY - PAP

## 2015-11-02 ENCOUNTER — Other Ambulatory Visit: Payer: Self-pay | Admitting: Obstetrics and Gynecology

## 2015-11-02 DIAGNOSIS — Z1231 Encounter for screening mammogram for malignant neoplasm of breast: Secondary | ICD-10-CM

## 2015-11-15 ENCOUNTER — Ambulatory Visit
Admission: RE | Admit: 2015-11-15 | Discharge: 2015-11-15 | Disposition: A | Discharge status: Home / Self Care | Payer: 59 | Source: Ambulatory Visit | Attending: Obstetrics and Gynecology | Admitting: Obstetrics and Gynecology

## 2015-11-15 DIAGNOSIS — Z1231 Encounter for screening mammogram for malignant neoplasm of breast: Secondary | ICD-10-CM

## 2016-11-24 ENCOUNTER — Other Ambulatory Visit: Payer: Self-pay | Admitting: Obstetrics and Gynecology

## 2016-11-24 DIAGNOSIS — Z1231 Encounter for screening mammogram for malignant neoplasm of breast: Secondary | ICD-10-CM

## 2016-12-17 ENCOUNTER — Ambulatory Visit
Admission: RE | Admit: 2016-12-17 | Discharge: 2016-12-17 | Disposition: A | Discharge status: Home / Self Care | Payer: 59 | Source: Ambulatory Visit | Attending: Obstetrics and Gynecology | Admitting: Obstetrics and Gynecology

## 2016-12-17 ENCOUNTER — Encounter: Payer: Self-pay | Admitting: Radiology

## 2016-12-17 DIAGNOSIS — Z1231 Encounter for screening mammogram for malignant neoplasm of breast: Secondary | ICD-10-CM

## 2017-04-17 DIAGNOSIS — L732 Hidradenitis suppurativa: Secondary | ICD-10-CM | POA: Insufficient documentation

## 2019-06-27 LAB — RESULTS CONSOLE HPV: CHL HPV: NEGATIVE

## 2019-06-27 LAB — HM PAP SMEAR: HM Pap smear: NEGATIVE

## 2021-10-02 ENCOUNTER — Other Ambulatory Visit: Payer: Self-pay | Admitting: Obstetrics and Gynecology

## 2021-10-02 DIAGNOSIS — Z1231 Encounter for screening mammogram for malignant neoplasm of breast: Secondary | ICD-10-CM

## 2021-10-30 ENCOUNTER — Ambulatory Visit (INDEPENDENT_AMBULATORY_CARE_PROVIDER_SITE_OTHER): Payer: No Typology Code available for payment source | Admitting: Podiatry

## 2021-10-30 ENCOUNTER — Ambulatory Visit (INDEPENDENT_AMBULATORY_CARE_PROVIDER_SITE_OTHER): Payer: No Typology Code available for payment source

## 2021-10-30 DIAGNOSIS — M21612 Bunion of left foot: Secondary | ICD-10-CM | POA: Diagnosis not present

## 2021-10-30 DIAGNOSIS — Z01818 Encounter for other preprocedural examination: Secondary | ICD-10-CM | POA: Diagnosis not present

## 2021-10-30 DIAGNOSIS — M2012 Hallux valgus (acquired), left foot: Secondary | ICD-10-CM | POA: Diagnosis not present

## 2021-10-30 DIAGNOSIS — M2010 Hallux valgus (acquired), unspecified foot: Secondary | ICD-10-CM

## 2021-10-31 NOTE — Progress Notes (Signed)
Subjective:  Patient ID: Sharon Bishop, female    DOB: Nov 04, 1970,  MRN: [id number]  No chief complaint on file.   51 y.o. female presents with the above complaint.  Patient presents with bilateral bunion deformity left greater than right side.  Patient states the left side is more painful and hurts with ambulation hurts with pressure she has not seen anyone else prior to seeing me.  She states it keeps getting bigger she is tried all conservative treatment options including offloading padding protecting.  She wanted to get it evaluated.  She is a diabetic on diet control.  Review of Systems: Negative except as noted in the HPI. Denies N/V/F/Ch.  No past medical history on file.  Current Outpatient Medications:    zolpidem (AMBIEN) 10 MG tablet, zolpidem 10 mg tablet  TK 1 T PO QD HS AS NEEDED FOR SLEEP, Disp: , Rfl:    chlorpheniramine-pyrilamine-phenylephrine suspension, Take 5 mLs by mouth 2 (two) times daily as needed for cough., Disp: 480 mL, Rfl: 11   fluconazole (DIFLUCAN) 150 MG tablet, Take 150 mg by mouth as needed., Disp: , Rfl:    HYDROcodone-acetaminophen (NORCO) 7.5-325 MG tablet, hydrocodone 7.5 mg-acetaminophen 325 mg tablet  TK 1 T PO Q 6 H PRF SEVERE PAIN, Disp: , Rfl:    metFORMIN (GLUCOPHAGE) 500 MG tablet, metformin 500 mg tablet  TK 1 T PO BID, Disp: , Rfl:    sulfamethoxazole-trimethoprim (BACTRIM DS) 800-160 MG tablet, sulfamethoxazole 800 mg-trimethoprim 160 mg tablet  TK 1 T PO Q 12 H FOR 10 DAYS, Disp: , Rfl:   Social History   Tobacco Use  Smoking Status Not on file  Smokeless Tobacco Not on file    Not on File Objective:  There were no vitals filed for this visit. There is no height or weight on file to calculate BMI. Constitutional Well developed. Well nourished.  Vascular Dorsalis pedis pulses palpable bilaterally. Posterior tibial pulses palpable bilaterally. Capillary refill normal to all digits.  No cyanosis or clubbing noted. Pedal hair growth  normal.  Neurologic Normal speech. Oriented to person, place, and time. Epicritic sensation to light touch grossly present bilaterally.  Dermatologic Nails well groomed and normal in appearance. No open wounds. No skin lesions.  Orthopedic: Normal joint ROM without pain or crepitus bilaterally. Hallux abductovalgus deformity present severe bunion deformity pain on palpation.  No intra-articular first MPJ pain noted.  There is a track bound not a tracking deformity. Left 1st MPJ diminished range of motion. Left 1st TMT with gross hypermobility. Right 1st MPJ diminished range of motion  Right 1st TMT without gross hypermobility. Lesser digital contractures present bilaterally.   Radiographs: Taken and reviewed. Hallux abductovalgus deformity present. Metatarsal parabola normal. 1st/2nd IMA: Severe on the left side moderate on the right side; TSP: 5 out of 7 bilaterally.  There is increasing hallux valgus angle.  Assessment:   1. Hallux valgus with bunions of left foot   2. Encounter for preoperative examination for general surgical procedure    Plan:  Patient was evaluated and treated and all questions answered.  Hallux abductovalgus deformity, with severe left and moderate right bunion deformity -XR as above. -Patient has failed all conservative therapy and wishes to proceed with surgical intervention. All risks, benefits, and alternatives discussed with patient. No guarantees given. Consent reviewed and signed by patient. Post-op course explained at length. -Planned procedures: Left Lapidus bunionectomy with Lapiplasty with a possible phalangeal osteotomy with fixation -Risk factors: None -I discussed my preoperative  intra and postoperative plan in extensive detail.  Given the severe nature of the bunion deformity of the left side patient will benefit from Lapidus fusion to help decrease the intermetatarsal angle.  I discussed with the patient she states understanding would like to  proceed with surgery.  She has failed all conservative treatment options including shoe gear modification offloading padding protecting wishes surgical intervention at this time.  I will plan on doing the right side in the future but will only require head osteotomy. -Informed surgical risk consent was reviewed and read aloud to the patient.  I reviewed the films.  I have discussed my findings with the patient in great detail.  I have discussed all risks including but not limited to infection, stiffness, scarring, limp, disability, deformity, damage to blood vessels and nerves, numbness, poor healing, need for braces, arthritis, chronic pain, amputation, death.  All benefits and realistic expectations discussed in great detail.  I have made no promises as to the outcome.  I have provided realistic expectations.  I have offered the patient a 2nd opinion, which they have declined and assured me they preferred to proceed despite the risks   No follow-ups on file.

## 2021-11-21 ENCOUNTER — Ambulatory Visit
Admission: RE | Admit: 2021-11-21 | Discharge: 2021-11-21 | Disposition: A | Discharge status: Home / Self Care | Payer: No Typology Code available for payment source | Source: Ambulatory Visit | Attending: Obstetrics and Gynecology | Admitting: Obstetrics and Gynecology

## 2021-11-21 DIAGNOSIS — Z1231 Encounter for screening mammogram for malignant neoplasm of breast: Secondary | ICD-10-CM

## 2021-11-21 LAB — HM MAMMOGRAPHY

## 2021-12-11 ENCOUNTER — Encounter: Payer: No Typology Code available for payment source | Admitting: Podiatry

## 2021-12-25 ENCOUNTER — Encounter: Payer: No Typology Code available for payment source | Admitting: Podiatry

## 2022-01-23 LAB — HEPATITIS B SURFACE ANTIGEN: Hepatitis B Surface Ag: NEGATIVE

## 2022-01-23 LAB — COMPREHENSIVE METABOLIC PANEL
Albumin: 4.2 (ref 3.5–5.0)
Calcium: 9.4 (ref 8.7–10.7)
Globulin: 2.5
eGFR: 69

## 2022-01-23 LAB — TSH: TSH: 1.53 (ref 0.41–5.90)

## 2022-01-23 LAB — BASIC METABOLIC PANEL
BUN: 11 (ref 4–21)
CO2: 21 (ref 13–22)
Chloride: 105 (ref 99–108)
Creatinine: 1 (ref 0.5–1.1)
Glucose: 146
Potassium: 4.3 mEq/L (ref 3.5–5.1)
Sodium: 141 (ref 137–147)

## 2022-01-23 LAB — HEPATIC FUNCTION PANEL
ALT: 6 U/L — AB (ref 7–35)
AST: 12 — AB (ref 13–35)
Alkaline Phosphatase: 84 (ref 25–125)
Bilirubin, Total: 0.3

## 2022-01-23 LAB — HM HEPATITIS C SCREENING LAB: HM Hepatitis Screen: NEGATIVE

## 2022-01-23 LAB — TESTOSTERONE: Testosterone: 25

## 2022-01-23 LAB — VITAMIN D 25 HYDROXY (VIT D DEFICIENCY, FRACTURES): Vit D, 25-Hydroxy: 15.4

## 2022-01-23 LAB — LIPID PANEL
Cholesterol: 255 — AB (ref 0–200)
HDL: 54 (ref 35–70)
LDL Cholesterol: 176
LDl/HDL Ratio: 3.3
Triglycerides: 140 (ref 40–160)

## 2022-01-23 LAB — HM HIV SCREENING LAB: HM HIV Screening: NEGATIVE

## 2022-03-12 ENCOUNTER — Encounter: Payer: Self-pay | Admitting: Internal Medicine

## 2022-03-12 ENCOUNTER — Ambulatory Visit (INDEPENDENT_AMBULATORY_CARE_PROVIDER_SITE_OTHER): Payer: No Typology Code available for payment source | Admitting: Internal Medicine

## 2022-03-12 VITALS — BP 153/86 | Temp 97.7°F | Resp 14 | Ht 61.0 in | Wt 138.6 lb

## 2022-03-12 DIAGNOSIS — Z1211 Encounter for screening for malignant neoplasm of colon: Secondary | ICD-10-CM | POA: Diagnosis not present

## 2022-03-12 DIAGNOSIS — Z119 Encounter for screening for infectious and parasitic diseases, unspecified: Secondary | ICD-10-CM | POA: Diagnosis not present

## 2022-03-12 DIAGNOSIS — I10 Essential (primary) hypertension: Secondary | ICD-10-CM | POA: Diagnosis not present

## 2022-03-12 DIAGNOSIS — G40909 Epilepsy, unspecified, not intractable, without status epilepticus: Secondary | ICD-10-CM

## 2022-03-12 DIAGNOSIS — Z0000 Encounter for general adult medical examination without abnormal findings: Secondary | ICD-10-CM

## 2022-03-12 DIAGNOSIS — E0843 Diabetes mellitus due to underlying condition with diabetic autonomic (poly)neuropathy: Secondary | ICD-10-CM

## 2022-03-12 LAB — POCT GLYCOSYLATED HEMOGLOBIN (HGB A1C): Hemoglobin A1C: 7.6 % — AB (ref 4.0–5.6)

## 2022-03-12 MED ORDER — ROSUVASTATIN CALCIUM 20 MG PO TABS: 20.0000 mg | ORAL_TABLET | Freq: Every day | ORAL | 3 refills | Status: DC

## 2022-03-12 MED ORDER — LOSARTAN POTASSIUM 50 MG PO TABS: 50.0000 mg | ORAL_TABLET | Freq: Every day | ORAL | 3 refills | Status: DC

## 2022-03-12 MED ORDER — METFORMIN HCL 500 MG PO TABS: 500.0000 mg | ORAL_TABLET | Freq: Two times a day (BID) | ORAL | 3 refills | Status: AC

## 2022-03-12 NOTE — Progress Notes (Signed)
Today's healthcare provider: Loralee Pacas, MD  Phone: [phone]  New patient visit  Visit Date: 03/12/2022 Patient: Sharon Bishop   DOB: 1971-01-29   51 y.o. Female  MRN: [id number]  Assessment and Plan:   This is a very pleasant 51 year old lady who looks like she could be 51 years old and is really not overweight centrally but somehow still has diabetes and hypertension.  Her A1c was 7.6 and we talked about how that can damage her body and she is going to start metformin.  We also talked about her blood pressure being high and starting losartan to help protect her kidneys.  We will bring her back in 3 months and recheck A1c and lipid which she decided not to do in order to save money since she is on a HSA high deductible plan she wants to minimize spending as there is no insurance assistance on these tests.   [name] was seen today for establish care, discuss hypertension and diabetes.  Colon cancer screening -     Cologuard  Preventative health care -     CBC with Differential/Platelet -     Lipid panel -     Comprehensive metabolic panel -     POCT glycosylated hemoglobin (Hb A1C) -     Ambulatory referral to Ophthalmology -     Ambulatory referral to Podiatry  Screening examination for infectious disease  Diabetes mellitus due to underlying condition with diabetic autonomic neuropathy, unspecified whether long term insulin use ([place]) Assessment & Plan: Diabetes is currently poorly controlled so she will start metformin a1c checked today was 7.6% Diabetic education: ongoing education regarding chronic disease management for diabetes was given today. We continue to reinforce the ABC's of diabetic management: A1c (<7 or 8 dependent upon patient), tight blood pressure control, and cholesterol management with goal LDL < 100 minimally (she also will start cholesterol med). We discuss diet strategies, exercise recommendations, medication options and possible side effects. At each  visit, we will review recommended immunizations and preventive care recommendations for diabetics and stress that good diabetic control can prevent other problems. See below for this patient's data. Lab Results  Component Value Date   HGBA1C 7.6 (A) 03/12/2022   HGBA1C 6.5 01/29/2010    She agrees to see ophto after discussion Was f/w podiatry patel for bunion will help re-establish by ref order Offered dexcom but she didn't want to have it stuck on to her and there were price concerns    Orders: -     metFORMIN HCl; Take 1 tablet (500 mg total) by mouth 2 (two) times daily with a meal. Start at half tablet twice daily for first week.  Dispense: 180 tablet; Refill: 3 -     Rosuvastatin Calcium; Take 1 tablet (20 mg total) by mouth daily.  Dispense: 90 tablet; Refill: 3  Essential hypertension Overview: .   Assessment & Plan: Counseled limit salt, limit alcohol, nsaids, weight gain. Goal 140/90 explained Start losartan at 50 mg po daily - discussed recommendations. No need for resistant htn w/u yet   Orders: -     Losartan Potassium; Take 1 tablet (50 mg total) by mouth daily. Start at half tablet daily for first week.  Dispense: 90 tablet; Refill: 3     Health Maintenance  Topic Date Due   FOOT EXAM  Never done   OPHTHALMOLOGY EXAM  Never done   HIV Screening  Never done   Diabetic kidney evaluation - Urine ACR  Never  done   Hepatitis C Screening  Never done   Diabetic kidney evaluation - GFR measurement  01/30/2011   COLONOSCOPY (Pts 45-87yr Insurance coverage will need to be confirmed)  Never done   PAP SMEAR-Modifier  10/29/2017   HEMOGLOBIN A1C  09/11/2022   MAMMOGRAM  11/22/2023   HPV VACCINES  Aged Out   INFLUENZA VACCINE  Discontinued   TETANUS/TDAP  Discontinued   COVID-19 Vaccine  Discontinued   Zoster Vaccines- Shingrix  Discontinued      Recommended follow up: Return in about 3 months (around 06/12/2022).   Subjective:  Patient presents today to  establish care. .  Chief Complaint  Patient presents with   Establish Care   Discuss hypertension   Diabetes    For history taking, I took a per problem history from the patient and chart review as follows: Problem  Diabetes Mellitus Due to Underlying Condition With Diabetic Autonomic (Poly)neuropathy ([facility])  HYPERLIPIDEMIA   Lipid Panel     Component Value Date/Time   CHOL 200 01/29/2010 1059   TRIG 113.0 01/29/2010 1059   TRIG 126 10/10/2009 0000   HDL 49.40 01/29/2010 1059   CHOLHDL 4 01/29/2010 1059   VLDL 22.6 01/29/2010 1059   LDLCALC 128 (H) 01/29/2010 1059   LDLCALC 129 10/10/2009 0000     Essential Hypertension   .       Depression Screen    03/12/2022   10:36 AM  PHQ 2/9 Scores  PHQ - 2 Score 0   The following were reviewed and entered/updated in epic: Past Medical History:  Diagnosis Date   Allergy    Anemia    Diabetes mellitus without complication ([place]    Hypertension    Seizures ([facility] [place]    Past Surgical History:  Procedure Laterality Date   ABDOMINAL HYSTERECTOMY     BREAST BIOPSY Right 2014   benign   Past Surgical History:  Procedure Laterality Date   ABDOMINAL HYSTERECTOMY     BREAST BIOPSY Right 2014   benign   Family Status  Relation Name Status   PEthlyn Daniels (Not Specified)   PEthlyn Daniels (Not Specified)   Mat Aunt PGabriel Cirri(Not Specified)   Pat Uncle AAlexander Mt(Not Specified)   Family History  Problem Relation Age of Onset   Breast cancer Paternal Aunt    Breast cancer Paternal Aunt    Cancer Maternal Aunt    Cancer Paternal Uncle    Outpatient Medications Prior to Visit  Medication Sig Dispense Refill   glucose blood test strip OneTouch Verio test strips  TEST THREE TIMES DAILY AS DIRECTED     Multiple Vitamin (MULTI-VITAMIN DAILY PO) Multi Vitamin     tretinoin (RETIN-A) 05.364% cream 1 application in the evening to face Externally Once a day     zolpidem (AMBIEN) 10 MG tablet zolpidem 10 mg tablet  TK 1 T PO QD  HS AS NEEDED FOR SLEEP     chlorpheniramine-pyrilamine-phenylephrine suspension Take 5 mLs by mouth 2 (two) times daily as needed for cough. 480 mL 11   fluconazole (DIFLUCAN) 150 MG tablet Take 150 mg by mouth as needed.     HYDROcodone-acetaminophen (NORCO) 7.5-325 MG tablet hydrocodone 7.5 mg-acetaminophen 325 mg tablet  TK 1 T PO Q 6 H PRF SEVERE PAIN     metFORMIN (GLUCOPHAGE) 500 MG tablet metformin 500 mg tablet  TK 1 T PO BID     sulfamethoxazole-trimethoprim (BACTRIM DS) 800-160 MG tablet sulfamethoxazole 800 mg-trimethoprim 160  mg tablet  TK 1 T PO Q 12 H FOR 10 DAYS     No facility-administered medications prior to visit.    Allergies  Allergen Reactions   Strawberry (Diagnostic) Itching, Palpitations and Shortness Of Breath   Social History   Tobacco Use   Smoking status: Every Day    Types: Cigars   Smokeless tobacco: Never  Vaping Use   Vaping Use: Former  Substance Use Topics   Alcohol use: Yes    Alcohol/week: 2.0 standard drinks of alcohol    Types: 2 Shots of liquor per week    Comment: Occasionally on weekends   Drug use: Not Currently    Types: Marijuana    Comment: CBD     There is no immunization history on file for this patient.  Objective:  BP (!) 153/86 (BP Location: Right Arm, Patient Position: Sitting)   Temp 97.7 F (36.5 C) (Temporal)   Resp 14   Ht '5\' 1"'$  (1.549 m)   Wt 138 lb 9.6 oz (62.9 kg)   BMI 26.19 kg/m  Body mass index is 26.19 kg/m.  She  is a very cordial and polite person who was a pleasure to meet.  Gen: NAD, resting comfortably  HEENT: Mucous membranes are moist. Sclera conjunctiva and lids grossly normal Neck: no thyromegaly, no cervical lymphadenopathy Ext: no edema Skin: warm, dry Neuro: grossly intact  Results for orders placed or performed in visit on 03/12/22  POCT HgB A1C  Result Value Ref Range   Hemoglobin A1C 7.6 (A) 4.0 - 5.6 %

## 2022-03-12 NOTE — Patient Instructions (Addendum)
It was a pleasure seeing you today!  Today the plan is...  Sharon Bishop was seen today for establish care, discuss hypertension and diabetes.  Colon cancer screening  Preventative health care  Screening examination for infectious disease  Diabetes mellitus due to underlying condition with diabetic autonomic neuropathy, unspecified whether long term insulin use ([place]) Assessment & Plan: Diabetes is currently poorly controlled. Diabetic education: ongoing education regarding chronic disease management for diabetes was given today. We continue to reinforce the ABC's of diabetic management: A1c (<7 or 8 dependent upon patient), tight blood pressure control, and cholesterol management with goal LDL < 100 minimally. We discuss diet strategies, exercise recommendations, medication options and possible side effects. At each visit, we review recommended immunizations and preventive care recommendations for diabetics and stress that good diabetic control can prevent other problems. See below for this patient's data. She agrees to see ophto after discussion Was f/w podiatry patel for bunion     Essential hypertension Overview: .   Assessment & Plan: Counseled limit salt, limit alcohol, nsaids, weight gain. Goal 140/90 explained Start losartan at 50 mg po daily - discussed recommendations. No need for resistant htn w/u yet        Loralee Pacas, MD   Return in about 3 months (around 06/12/2022).   - If your condition fails to resolve or you have other questions / concerns: please contact me via phone [phone] or MyChart messaging.  - Please bring all your medicines to your next appointment. This is the best way for me to know exactly what you're taking.  - If your condition begins to worsen or become severe:  go to the ER.   IF you received an x-ray today, you will receive an invoice from [facility]. Please contact [facility] at [phone] with questions or  concerns regarding your invoice.    IF you received labwork today, you will receive an invoice from [facility]. Please contact [facility] at [phone] with questions or concerns regarding your invoice.    Our billing staff will not be able to assist you with questions regarding bills from these companies.   --------------------------------------------------------------------------------------------------------------------  You will be contacted with the lab results as soon as they are available. The fastest way to get your results is to activate your My Chart account. Instructions are located on the last page of this paperwork. If you have not heard from [place] regarding the results in 2 weeks, please contact this office. For any labs or imaging tests, we will call you if the results are significantly abnormal.  Most normal results will be posted to myChart as soon as they are available and I will comment on them there within 2-3 business days.

## 2022-03-12 NOTE — Assessment & Plan Note (Signed)
Counseled limit salt, limit alcohol, nsaids, weight gain. Goal 140/90 explained Start losartan at 50 mg po daily - discussed recommendations. No need for resistant htn w/u yet   

## 2022-03-12 NOTE — Assessment & Plan Note (Addendum)
Diabetes is currently poorly controlled so she will start metformin a1c checked today was 7.6% Diabetic education: ongoing education regarding chronic disease management for diabetes was given today. We continue to reinforce the ABC's of diabetic management: A1c (<7 or 8 dependent upon patient), tight blood pressure control, and cholesterol management with goal LDL < 100 minimally (she also will start cholesterol med). We discuss diet strategies, exercise recommendations, medication options and possible side effects. At each visit, we will review recommended immunizations and preventive care recommendations for diabetics and stress that good diabetic control can prevent other problems. See below for this patient's data. Lab Results  Component Value Date   HGBA1C 7.6 (A) 03/12/2022   HGBA1C 6.5 01/29/2010    She agrees to see ophto after discussion Was f/w podiatry patel for bunion will help re-establish by ref order Offered dexcom but she didn't want to have it stuck on to her and there were price concerns

## 2022-03-25 ENCOUNTER — Encounter: Payer: Self-pay | Admitting: Internal Medicine

## 2022-03-25 DIAGNOSIS — M8580 Other specified disorders of bone density and structure, unspecified site: Secondary | ICD-10-CM | POA: Insufficient documentation

## 2022-03-25 DIAGNOSIS — D249 Benign neoplasm of unspecified breast: Secondary | ICD-10-CM | POA: Insufficient documentation

## 2022-03-25 DIAGNOSIS — R7303 Prediabetes: Secondary | ICD-10-CM | POA: Insufficient documentation

## 2022-05-27 ENCOUNTER — Encounter: Payer: Self-pay | Admitting: Internal Medicine

## 2022-06-12 ENCOUNTER — Ambulatory Visit: Payer: No Typology Code available for payment source | Admitting: Internal Medicine

## 2022-06-30 LAB — COLOGUARD

## 2022-12-18 ENCOUNTER — Other Ambulatory Visit: Payer: Self-pay | Admitting: Obstetrics and Gynecology

## 2022-12-18 DIAGNOSIS — Z720 Tobacco use: Secondary | ICD-10-CM

## 2022-12-18 DIAGNOSIS — Z1231 Encounter for screening mammogram for malignant neoplasm of breast: Secondary | ICD-10-CM

## 2023-01-23 ENCOUNTER — Ambulatory Visit
Admission: RE | Admit: 2023-01-23 | Discharge: 2023-01-23 | Disposition: A | Discharge status: Home / Self Care | Payer: No Typology Code available for payment source | Source: Ambulatory Visit | Attending: Obstetrics and Gynecology | Admitting: Obstetrics and Gynecology

## 2023-01-23 DIAGNOSIS — Z1231 Encounter for screening mammogram for malignant neoplasm of breast: Secondary | ICD-10-CM

## 2023-03-05 ENCOUNTER — Other Ambulatory Visit: Payer: Self-pay | Admitting: Obstetrics and Gynecology

## 2023-03-05 ENCOUNTER — Ambulatory Visit
Admission: RE | Admit: 2023-03-05 | Discharge: 2023-03-05 | Disposition: A | Discharge status: Home / Self Care | Payer: No Typology Code available for payment source | Source: Ambulatory Visit | Attending: Obstetrics and Gynecology | Admitting: Obstetrics and Gynecology

## 2023-03-05 DIAGNOSIS — Z720 Tobacco use: Secondary | ICD-10-CM

## 2023-03-06 ENCOUNTER — Encounter: Payer: Self-pay | Admitting: Obstetrics and Gynecology

## 2023-03-20 ENCOUNTER — Ambulatory Visit
Admission: RE | Admit: 2023-03-20 | Discharge: 2023-03-20 | Disposition: A | Discharge status: Home / Self Care | Payer: No Typology Code available for payment source | Source: Ambulatory Visit | Attending: Obstetrics and Gynecology | Admitting: Obstetrics and Gynecology

## 2023-03-20 DIAGNOSIS — Z720 Tobacco use: Secondary | ICD-10-CM

## 2023-04-04 LAB — EXTERNAL GENERIC LAB PROCEDURE

## 2023-04-12 NOTE — Progress Notes (Unsigned)
Cardiology Office Note:  .   Date:  04/15/2023 ID:  Sharon Bishop, DOB 04-12-1971, MRN [id number] PCP: Lula Olszewski, MD [facility] Providers Cardiologist:  None   Patient Profile: .      PMH Coronary artery calcification Aortic atherosclerosis Type 2 diabetes Hyperlipidemia Hypertension Tobacco use disorder       History of Present Illness: Sharon Bishop is a very pleasant 52 y.o. female  who is here today for new patient consult for coronary artery calcification and aortic atherosclerosis noted on lung CT completed 03/20/23. 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 Reports she is actively working out 3-4 x per week - walking, roller skating, dancing, no weight lifting. Admits she has not been compliant with rosuvastatin since it was prescribed last year.  She does not necessarily want to lose weight, feels that her weight is healthy. Is frustrated by recent diagnoses of hypertension and diabetes. Has had more abdominal fat since hysterectomy that is difficult to lose. She denies chest pain, shortness of breath, lower extremity edema, fatigue, palpitations, presyncope, syncope, orthopnea, and PND. She does not like to cook and frequently eats out at [employer] and other restaurants. Prefers not to eat meat. Drinks peach tea which is a packet she adds to water frequently.   Family history: Her family history includes Breast cancer in her paternal aunt and paternal aunt; Cancer in her maternal aunt and paternal uncle.   ASCVD Risk Score: The 10-year ASCVD risk score (Arnett DK, et al., 2019) is: 24.6%*   Values used to calculate the score:     Age: 23 years     Sex: Female     Is Non-Hispanic African American: Yes     Diabetic: Yes     Tobacco smoker: Yes     Systolic Blood Pressure: 132 mmHg     Is BP treated: Yes     HDL Cholesterol: 54 mg/dL*      Total Cholesterol: 255 mg/dL*     * - Cholesterol units were assumed for this score calculation   Diet: No red meat Only fish, chicken Broccoli and corn Lots of brown rice Eats out a lot - [employer] Sweet tea, puts peach tea packet in water   Activity:  ROS: See HPI       Studies Reviewed: Marland Kitchen   EKG Interpretation Date/Time:  Wednesday April 15 2023 09:15:20 EST Ventricular Rate:  52 PR Interval:  152 QRS Duration:  76 QT Interval:  446 QTC Calculation: 414 R Axis:   -8  Text Interpretation: Sinus bradycardia Minimal voltage criteria for LVH, may be normal variant ( R in aVL ) No ST abnormality Confirmed by Eligha Bridegroom [phone]) on 04/15/2023 9:17:19 AM       Risk Assessment/Calculations:             Physical Exam:   VS: BP 132/82   Pulse (!) 52   Ht 5\' 1"  (1.549 m)   Wt 150 lb 14.4 oz (68.4 kg)   BMI 28.51 kg/m   Wt Readings from Last 3 Encounters:  04/15/23 150 lb 14.4 oz (68.4 kg)  03/12/22 138 lb 9.6 oz (62.9 kg)     GEN: Well nourished, well developed in no acute distress NECK: No JVD; No carotid bruits CARDIAC: RRR, no murmurs, rubs, gallops RESPIRATORY:  Clear to auscultation without rales,  wheezing or rhonchi  ABDOMEN: Soft, non-tender, non-distended EXTREMITIES:  No edema; No deformity     ASSESSMENT AND PLAN: .    Coronary artery calcification/Aortic atherosclerosis: Calcification circumflex artery and aortic atherosclerosis noted on chest CT 03/2023. She is active with frequent dancing and roller skating. No regular weight lifting or resistance training. She denies chest pain, dyspnea, or other symptoms concerning for angina. EKG today without ST abnormality. We discussed further testing for mapping of coronary artery calcification. She will consider a CT calcium score. Emphasized the importance of secondary prevention including heart healthy mostly plant based diet avoiding saturated fat, processed foods, simple carbohydrates, and sugar along  with aiming for at least 150 minutes of moderate intensity exercise each week.  LDL goal 70 or lower as noted below.   Hyperlipidemia LDL goal < 70: Last lipid panel 01/23/22 with total cholesterol 255, HDL 54, LDL 176, triglycerides 140. Admits she was not consistent with rosuvastatin. We will recheck today and additionally will get apolipoprotein B and lipoprotein a for further risk stratification.  Advised that if LDL remains > 70, would recommend she start moderate to high dose rosuvastatin or atorvastatin. She asks that we keep medications at low cost.    Diabetes: Diagnosed with T2DM 03/2022 at which time A1C was 7.6%. She was started on metformin. We discussed the benefit of heart healthy diet for management of diabetes. Management per PCP.   Hypertension: BP initially elevated and improved on my recheck. She does not monitor BP at home.  Encouraged routine BP monitoring. We will recheck renal function today. Continue losartan.   CV Risk Assessment: ASCVD risk score 24.6%.  Risks include hyperlipidemia, diabetes, and hypertension. No significant heart disease in her family. She plans to consider CT calcium score.  Lengthy discussion about heart healthy diet and regular exercise along with management of hyperlipidemia, diabetes, and hypertension. Would recommend soon follow-up with PCP for management of diabetes.   Plan/Goals: Review dietary guidance given including a more plant based or Mediterranean style diet and incorporate healthy choices Decrease frequency of [employer] Continue to aim for 150 minutes of moderate intensity exercise each week       Dispo: 3 months with me  Signed, Eligha Bridegroom, NP-C

## 2023-04-15 ENCOUNTER — Encounter (HOSPITAL_BASED_OUTPATIENT_CLINIC_OR_DEPARTMENT_OTHER): Payer: Self-pay | Admitting: Nurse Practitioner

## 2023-04-15 ENCOUNTER — Ambulatory Visit (HOSPITAL_BASED_OUTPATIENT_CLINIC_OR_DEPARTMENT_OTHER): Payer: No Typology Code available for payment source | Admitting: Nurse Practitioner

## 2023-04-15 VITALS — BP 132/82 | HR 52 | Ht 61.0 in | Wt 150.9 lb

## 2023-04-15 DIAGNOSIS — I700 Atherosclerosis of aorta: Secondary | ICD-10-CM

## 2023-04-15 DIAGNOSIS — I10 Essential (primary) hypertension: Secondary | ICD-10-CM

## 2023-04-15 DIAGNOSIS — I2510 Atherosclerotic heart disease of native coronary artery without angina pectoris: Secondary | ICD-10-CM | POA: Diagnosis not present

## 2023-04-15 DIAGNOSIS — Z7189 Other specified counseling: Secondary | ICD-10-CM

## 2023-04-15 DIAGNOSIS — E119 Type 2 diabetes mellitus without complications: Secondary | ICD-10-CM | POA: Diagnosis not present

## 2023-04-15 DIAGNOSIS — E785 Hyperlipidemia, unspecified: Secondary | ICD-10-CM

## 2023-04-15 NOTE — Patient Instructions (Signed)
Medication Instructions:   Your physician recommends that you continue on your current medications as directed. Please refer to the Current Medication list given to you today.   *If you need a refill on your cardiac medications before your next appointment, please call your pharmacy*   Lab Work:  TODAY!!!!! NMR/APOLIPO/LPA/CMET  If you have labs (blood work) drawn today and your tests are completely normal, you will receive your results only by: MyChart Message (if you have MyChart) OR A paper copy in the mail If you have any lab test that is abnormal or we need to change your treatment, we will call you to review the results.   Testing/Procedures:  None ordered.   Follow-Up: At [facility], you and your health needs are our priority.  As part of our continuing mission to provide you with exceptional heart care, we have created designated Provider Care Teams.  These Care Teams include your primary Cardiologist (physician) and Advanced Practice Providers (APPs -  Physician Assistants and Nurse Practitioners) who all work together to provide you with the care you need, when you need it.  We recommend signing up for the patient portal called "MyChart".  Sign up information is provided on this After Visit Summary.  MyChart is used to connect with patients for Virtual Visits (Telemedicine).  Patients are able to view lab/test results, encounter notes, upcoming appointments, etc.  Non-urgent messages can be sent to your provider as well.   To learn more about what you can do with MyChart, go to [email or web address].    Your next appointment:   3 month(s)  Provider:   Eligha Bridegroom, NP    Other Instructions  Please let us know about a Calcuim Score.   Test locations:  [facility]  [facility]  This is $99 out of pocket.   Coronary CalciumScan A coronary calcium scan is  an imaging test used to look for deposits of calcium and other fatty materials (plaques) in the inner lining of the blood vessels of the heart (coronary arteries). These deposits of calcium and plaques can partly clog and narrow the coronary arteries without producing any symptoms or warning signs. This puts a person at risk for a heart attack. This test can detect these deposits before symptoms develop. Tell a health care provider about: Any allergies you have. All medicines you are taking, including vitamins, herbs, eye drops, creams, and over-the-counter medicines. Any problems you or family members have had with anesthetic medicines. Any blood disorders you have. Any surgeries you have had. Any medical conditions you have. Whether you are pregnant or may be pregnant. What are the risks? Generally, this is a safe procedure. However, problems may occur, including: Harm to a pregnant woman and her unborn baby. This test involves the use of radiation. Radiation exposure can be dangerous to a pregnant woman and her unborn baby. If you are pregnant, you generally should not have this procedure done. Slight increase in the risk of cancer. This is because of the radiation involved in the test. What happens before the procedure? No preparation is needed for this procedure. What happens during the procedure? You will undress and remove any jewelry around your neck or chest. You will put on a hospital gown. Sticky electrodes will be placed on your chest. The electrodes will be connected to an electrocardiogram (ECG) machine to record a tracing of the electrical activity of your heart. A CT  scanner will take pictures of your heart. During this time, you will be asked to lie still and hold your breath for 2-3 seconds while a picture of your heart is being taken. The procedure may vary among health care providers and hospitals. What happens after the procedure? You can get dressed. You can return to  your normal activities. It is up to you to get the results of your test. Ask your health care provider, or the department that is doing the test, when your results will be ready. Summary A coronary calcium scan is an imaging test used to look for deposits of calcium and other fatty materials (plaques) in the inner lining of the blood vessels of the heart (coronary arteries). Generally, this is a safe procedure. Tell your health care provider if you are pregnant or may be pregnant. No preparation is needed for this procedure. A CT scanner will take pictures of your heart. You can return to your normal activities after the scan is done. This information is not intended to replace advice given to you by your health care provider. Make sure you discuss any questions you have with your health care provider. Document Released: 11/22/2007 Document Revised: 04/14/2016 Document Reviewed: 04/14/2016 Elsevier Interactive Patient Education  2017 [employer].    Please look into TDEE Calculators or My Fitness Pal.  Adopting a Healthy Lifestyle.   Weight: Know what a healthy weight is for you (roughly BMI <25) and aim to maintain this. You can calculate your body mass index on your smart phone. Unfortunately, this is not the most accurate measure of healthy weight, but it is the simplest measurement to use. A more accurate measurement involves body scanning which measures lean muscle, fat tissue and bony density. We do not have this equipment at [facility].    Diet: Aim for 7+ servings of fruits and vegetables daily Limit animal fats in diet for cholesterol and heart health - choose grass fed whenever available Avoid highly processed foods (fast food burgers, tacos, fried chicken, pizza, hot dogs, french fries)  Saturated fat comes in the form of butter, lard, coconut oil, margarine, partially hydrogenated oils, and fat in meat. These increase your risk of cardiovascular disease.  Use healthy plant oils,  such as olive, canola, soy, corn, sunflower and peanut.  Whole foods such as fruits, vegetables and whole grains have fiber  Men need > 38 grams of fiber per day Women need > 25 grams of fiber per day  Load up on vegetables and fruits - one-half of your plate: Aim for color and variety, and remember that potatoes dont count. Go for whole grains - one-quarter of your plate: Whole wheat, barley, wheat berries, quinoa, oats, brown rice, and foods made with them. If you want pasta, go with whole wheat pasta. Protein power - one-quarter of your plate: Fish, chicken, beans, and nuts are all healthy, versatile protein sources. Limit red meat. You need carbohydrates for energy! The type of carbohydrate is more important than the amount. Choose carbohydrates such as vegetables, fruits, whole grains, beans, and nuts in the place of white rice, white pasta, potatoes (baked or fried), macaroni and cheese, cakes, cookies, and donuts.  If youre thirsty, drink water. Coffee and tea are good in moderation, but skip sugary drinks and limit milk and dairy products to one or two daily servings. Keep sugar intake at 6 teaspoons or 24 grams or LESS       Exercise: Aim for 150 min of moderate intensity  exercise weekly for heart health, and weights twice weekly for bone health Stay active - any steps are better than no steps! Aim for 7-9 hours of sleep daily          Mediterranean Diet  Why follow it? Research shows. Those who follow the Mediterranean diet have a reduced risk of heart disease  The diet is associated with a reduced incidence of Parkinson's and Alzheimer's diseases People following the diet may have longer life expectancies and lower rates of chronic diseases  The Dietary Guidelines for Americans recommends the Mediterranean diet as an eating plan to promote health and prevent disease  What Is the Mediterranean Diet?  Healthy eating plan based on typical foods and recipes of Mediterranean-style  cooking The diet is primarily a plant based diet; these foods should make up a majority of meals   Starches - Plant based foods should make up a majority of meals - They are an important sources of vitamins, minerals, energy, antioxidants, and fiber - Choose whole grains, foods high in fiber and minimally processed items  - Typical grain sources include wheat, oats, barley, corn, brown rice, bulgar, farro, millet, polenta, couscous  - Various types of beans include chickpeas, lentils, fava beans, black beans, white beans   Fruits  Veggies - Large quantities of antioxidant rich fruits & veggies; 6 or more servings  - Vegetables can be eaten raw or lightly drizzled with oil and cooked  - Vegetables common to the traditional Mediterranean Diet include: artichokes, arugula, beets, broccoli, brussel sprouts, cabbage, carrots, celery, collard greens, cucumbers, eggplant, kale, leeks, lemons, lettuce, mushrooms, okra, onions, peas, peppers, potatoes, pumpkin, radishes, rutabaga, shallots, spinach, sweet potatoes, turnips, zucchini - Fruits common to the Mediterranean Diet include: apples, apricots, avocados, cherries, clementines, dates, figs, grapefruits, grapes, melons, nectarines, oranges, peaches, pears, pomegranates, strawberries, tangerines  Fats - Replace butter and margarine with healthy oils, such as olive oil, canola oil, and tahini  - Limit nuts to no more than a handful a day  - Nuts include walnuts, almonds, pecans, pistachios, pine nuts  - Limit or avoid candied, honey roasted or heavily salted nuts - Olives are central to the [employer] - can be eaten whole or used in a variety of dishes   Meats Protein - Limiting red meat: no more than a few times a month - When eating red meat: choose lean cuts and keep the portion to the size of deck of cards - Eggs: approx. 0 to 4 times a week  - Fish and lean poultry: at least 2 a week  - Healthy protein sources include, chicken, [place],  lean beef, lamb - Increase intake of seafood such as tuna, salmon, trout, mackerel, shrimp, scallops - Avoid or limit high fat processed meats such as sausage and bacon  Dairy - Include moderate amounts of low fat dairy products  - Focus on healthy dairy such as fat free yogurt, skim milk, low or reduced fat cheese - Limit dairy products higher in fat such as whole or 2% milk, cheese, ice cream  Alcohol - Moderate amounts of red wine is ok  - No more than 5 oz daily for women (all ages) and men older than age 80  - No more than 10 oz of wine daily for men younger than 61  Other - Limit sweets and other desserts  - Use herbs and spices instead of salt to flavor foods  - Herbs and spices common to the traditional Mediterranean Diet include:  basil, bay leaves, chives, cloves, cumin, fennel, garlic, lavender, marjoram, mint, oregano, parsley, pepper, rosemary, sage, savory, sumac, tarragon, thyme   It's not just a diet, it's a lifestyle:  The Mediterranean diet includes lifestyle factors typical of those in the region  Foods, drinks and meals are best eaten with others and savored Daily physical activity is important for overall good health This could be strenuous exercise like running and aerobics This could also be more leisurely activities such as walking, housework, yard-work, or taking the stairs Moderation is the key; a balanced and healthy diet accommodates most foods and drinks Consider portion sizes and frequency of consumption of certain foods   Meal Ideas & Options:  Breakfast:  Whole wheat toast or whole wheat English muffins with peanut butter & hard boiled egg Steel cut oats topped with apples & cinnamon and skim milk  Fresh fruit: banana, strawberries, melon, berries, peaches  Smoothies: strawberries, bananas, greek yogurt, peanut butter Low fat greek yogurt with blueberries and granola  Egg white omelet with spinach and mushrooms Breakfast couscous: whole wheat couscous,  apricots, skim milk, cranberries  Sandwiches:  Hummus and grilled vegetables (peppers, zucchini, squash) on whole wheat bread   Grilled chicken on whole wheat pita with lettuce, tomatoes, cucumbers or tzatziki  [place] salad on whole wheat bread: tuna salad made with greek yogurt, olives, red peppers, capers, green onions Garlic rosemary lamb pita: lamb sauted with garlic, rosemary, salt & pepper; add lettuce, cucumber, greek yogurt to pita - flavor with lemon juice and black pepper  Seafood:  Mediterranean grilled salmon, seasoned with garlic, basil, parsley, lemon juice and black pepper Shrimp, lemon, and spinach whole-grain pasta salad made with low fat greek yogurt  Seared scallops with lemon orzo  Seared tuna steaks seasoned salt, pepper, coriander topped with tomato mixture of olives, tomatoes, olive oil, minced garlic, parsley, green onions and cappers  Meats:  Herbed greek chicken salad with kalamata olives, cucumber, feta  Red bell peppers stuffed with spinach, bulgur, lean ground beef (or lentils) & topped with feta   Kebabs: skewers of chicken, tomatoes, onions, zucchini, squash  [place] burgers: made with red onions, mint, dill, lemon juice, feta cheese topped with roasted red peppers Vegetarian Cucumber salad: cucumbers, artichoke hearts, celery, red onion, feta cheese, tossed in olive oil & lemon juice  Hummus and whole grain pita points with a greek salad (lettuce, tomato, feta, olives, cucumbers, red onion) Lentil soup with celery, carrots made with vegetable broth, garlic, salt and pepper  Tabouli salad: parsley, bulgur, mint, scallions, cucumbers, tomato, radishes, lemon juice, olive oil, salt and pepper.

## 2023-04-16 LAB — NMR, LIPOPROFILE
Cholesterol, Total: 261 mg/dL — ABNORMAL HIGH (ref 100–199)
HDL Particle Number: 37 umol/L (ref >=30.5)
HDL-C: 63 mg/dL (ref >39)
LDL Particle Number: 1934 nmol/L — ABNORMAL HIGH (ref <1000)
LDL Size: 21.4 nm (ref >20.5)
LDL-C (NIH Calc): 162 mg/dL — ABNORMAL HIGH (ref 0–99)
LP-IR Score: 39 (ref <=45)
Small LDL Particle Number: 527 nmol/L (ref <=527)
Triglycerides: 197 mg/dL — ABNORMAL HIGH (ref 0–149)

## 2023-04-16 LAB — COMPREHENSIVE METABOLIC PANEL
ALT: 14 [IU]/L (ref 0–32)
AST: 17 [IU]/L (ref 0–40)
Albumin: 4.3 g/dL (ref 3.8–4.9)
Alkaline Phosphatase: 104 [IU]/L (ref 44–121)
BUN/Creatinine Ratio: 12 (ref 9–23)
BUN: 12 mg/dL (ref 6–24)
Bilirubin Total: 0.3 mg/dL (ref 0.0–1.2)
CO2: 22 mmol/L (ref 20–29)
Calcium: 9.6 mg/dL (ref 8.7–10.2)
Chloride: 104 mmol/L (ref 96–106)
Creatinine, Ser: 0.97 mg/dL (ref 0.57–1.00)
Globulin, Total: 2.7 g/dL (ref 1.5–4.5)
Glucose: 135 mg/dL — ABNORMAL HIGH (ref 70–99)
Potassium: 3.9 mmol/L (ref 3.5–5.2)
Sodium: 142 mmol/L (ref 134–144)
Total Protein: 7 g/dL (ref 6.0–8.5)
eGFR: 70 mL/min/{1.73_m2} (ref >59)

## 2023-04-16 LAB — LIPOPROTEIN A (LPA): Lipoprotein (a): 278.7 nmol/L — ABNORMAL HIGH (ref <75.0)

## 2023-04-16 LAB — APOLIPOPROTEIN B: Apolipoprotein B: 138 mg/dL — ABNORMAL HIGH (ref <90)

## 2023-05-22 ENCOUNTER — Other Ambulatory Visit: Payer: Self-pay | Admitting: *Deleted

## 2023-05-22 DIAGNOSIS — E785 Hyperlipidemia, unspecified: Secondary | ICD-10-CM

## 2023-05-22 DIAGNOSIS — Z7189 Other specified counseling: Secondary | ICD-10-CM

## 2023-06-08 ENCOUNTER — Other Ambulatory Visit: Payer: Self-pay

## 2023-06-08 ENCOUNTER — Other Ambulatory Visit: Payer: Self-pay | Admitting: Internal Medicine

## 2023-06-08 DIAGNOSIS — I10 Essential (primary) hypertension: Secondary | ICD-10-CM

## 2023-06-08 DIAGNOSIS — E0843 Diabetes mellitus due to underlying condition with diabetic autonomic (poly)neuropathy: Secondary | ICD-10-CM

## 2023-06-08 NOTE — Telephone Encounter (Signed)
Spoke with patient an Sd was informed that she does not currently have insurance. Therefore, she is not able to come in for an OV. Sent in 30 days with 1 refill instead of 90 days with 3 refills to try to decrease the cost for patient. She also informed me that she has about a week of medication left. Advised her to call the office back for resources to decrease the cost after she speaks with pharmacy to get the price.

## 2023-06-08 NOTE — Telephone Encounter (Signed)
and

## 2023-06-24 ENCOUNTER — Telehealth: Payer: Self-pay

## 2023-06-24 NOTE — Telephone Encounter (Signed)
 Copied from CRM [id number]. Topic: Clinical - Prescription Issue >> Jun 24, 2023 12:10 PM Corin V wrote: Reason for CRM: Patient lost her job and no longer has insurance. 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 They are too expensive to fill right now and she didn't know if samples, alternate medications, savings cards, or assistance programs may be available. Please call patient back with possible options.  Message has been sent to provider

## 2023-06-24 NOTE — Telephone Encounter (Signed)
 Message has been sent to provider.

## 2023-06-24 NOTE — Telephone Encounter (Signed)
 Copied from CRM [id number]. Topic: Clinical - Prescription Issue >> Jun 24, 2023 12:10 PM Corin V wrote: Reason for CRM: Patient lost her job and no longer has insurance. 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 They are too expensive to fill right now and she didn't know if samples, alternate medications, savings cards, or assistance programs may be available. Please call patient back with possible options.  Please Advise

## 2023-06-30 ENCOUNTER — Other Ambulatory Visit: Payer: Self-pay

## 2023-06-30 DIAGNOSIS — E0843 Diabetes mellitus due to underlying condition with diabetic autonomic (poly)neuropathy: Secondary | ICD-10-CM

## 2023-06-30 DIAGNOSIS — I10 Essential (primary) hypertension: Secondary | ICD-10-CM

## 2023-06-30 NOTE — Telephone Encounter (Signed)
Called patient and informed her of Dr. Kandra Nicolas advice. Also, advised her about Good Rx. Confirmed that the [facility] pharmacy on file is a [place]. Was given cheaper prices from pharmacy using a discount card that is on file there. Informed patient and advised her to call them to have these prescriptions filled (she stated that she will).

## 2023-07-26 NOTE — Progress Notes (Deleted)
 Cardiology Office Note:  .   Date:  07/26/2023 ID:  Sharon Bishop, DOB 04-19-1971, MRN [id number] PCP: Lula Olszewski, MD [facility] Providers Cardiologist:  None   Patient Profile: .      PMH Coronary artery calcification Aortic atherosclerosis Type 2 diabetes Hyperlipidemia Hypertension Tobacco use disorder Elevated lipoprotein a  She was referred to cardiology and seen by me on 04/15/23 as a new patient for coronary artery calcification and aortic atherosclerosis noted on lung CT completed 03/20/23. 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 Reports she is actively working out 3-4 x per week - walking, roller skating, dancing, no weight lifting. Admits she has not been compliant with rosuvastatin since it was prescribed last year.  She does not necessarily want to lose weight, feels that her weight is healthy. Is frustrated by recent diagnoses of hypertension and diabetes. Has had more abdominal fat since hysterectomy that is difficult to lose. No significant cardiac family history. She denied chest pain, shortness of breath, lower extremity edema, fatigue, palpitations, presyncope, syncope, orthopnea, and PND. She does not like to cook and frequently eats out at [employer] and other restaurants. Prefers not to eat meat. Drinks peach tea which is a packet she adds to water frequently. Her ASCVD Risk score is 25.1%. For additional risk stratification, we completed NMR lipid panel,  LP(a) and Apo B testing which revealed concerning elevations. She was hesitant to start statin therapy or novel lipid lowering therapy but ultimately decided to *** She elected not to complete CT calcium scoring.        History of Present Illness: Sharon Bishop is a very pleasant 53 y.o. female  who is here today for      ROS: See HPI       Studies Reviewed: .           Risk  Assessment/Calculations:     No BP recorded.  {Refresh Note OR Click here to enter BP  :1}***       Physical Exam:   VS: There were no vitals taken for this visit.  Wt Readings from Last 3 Encounters:  04/15/23 150 lb 14.4 oz (68.4 kg)  03/12/22 138 lb 9.6 oz (62.9 kg)     GEN: Well nourished, well developed in no acute distress NECK: No JVD; No carotid bruits CARDIAC: RRR, no murmurs, rubs, gallops RESPIRATORY:  Clear to auscultation without rales, wheezing or rhonchi  ABDOMEN: Soft, non-tender, non-distended EXTREMITIES:  No edema; No deformity     ASSESSMENT AND PLAN: .    Coronary artery calcification/Aortic atherosclerosis: Calcification circumflex artery and aortic atherosclerosis noted on chest CT 03/2023. She is active with frequent dancing and roller skating. No regular weight lifting or resistance training. She denies chest pain, dyspnea, or other symptoms concerning for angina. EKG today without ST abnormality. We discussed further testing for mapping of coronary artery calcification. She will consider a CT calcium score. Emphasized the importance of secondary prevention including heart healthy mostly plant based diet avoiding saturated fat, processed foods, simple carbohydrates, and sugar along with aiming for at least 150 minutes of moderate intensity exercise each week.  LDL goal 70 or lower as noted below.   Hyperlipidemia LDL goal < 70: Last lipid panel 01/23/22 with total cholesterol 255, HDL 54, LDL 176, triglycerides 140. Admits she was not consistent with rosuvastatin. We  will recheck today and additionally will get apolipoprotein B and lipoprotein a for further risk stratification.  Advised that if LDL remains > 70, would recommend she start moderate to high dose rosuvastatin or atorvastatin. She asks that we keep medications at low cost.    Diabetes: Diagnosed with T2DM 03/2022 at which time A1C was 7.6%. She was started on metformin. We discussed the benefit of heart  healthy diet for management of diabetes. Management per PCP.   Hypertension: BP initially elevated and improved on my recheck. She does not monitor BP at home.  Encouraged routine BP monitoring. We will recheck renal function today. Continue losartan.   CV Risk Assessment: ASCVD risk score 24.6%.  Risks include hyperlipidemia, diabetes, and hypertension. No significant heart disease in her family. She plans to consider CT calcium score.  Lengthy discussion about heart healthy diet and regular exercise along with management of hyperlipidemia, diabetes, and hypertension. Would recommend soon follow-up with PCP for management of diabetes.   Plan/Goals: Review dietary guidance given including a more plant based or Mediterranean style diet and incorporate healthy choices Decrease frequency of [employer] Continue to aim for 150 minutes of moderate intensity exercise each week       Disposition: ***  Signed, Eligha Bridegroom, NP-C

## 2023-07-29 ENCOUNTER — Ambulatory Visit (HOSPITAL_BASED_OUTPATIENT_CLINIC_OR_DEPARTMENT_OTHER): Payer: No Typology Code available for payment source | Admitting: Nurse Practitioner

## 2024-01-06 IMAGING — MG MM DIGITAL SCREENING BILAT W/ TOMO AND CAD
8 series · 8 of 24 positions shown · non-contrast
Comparison: Previous exam(s).

CLINICAL DATA: Screening.

EXAM:
DIGITAL SCREENING BILATERAL MAMMOGRAM WITH TOMOSYNTHESIS AND CAD
TECHNIQUE: Bilateral screening digital craniocaudal and mediolateral oblique
mammograms were obtained. Bilateral screening digital breast
tomosynthesis was performed. The images were evaluated with
computer-aided detection.

[R CC synth-2D]
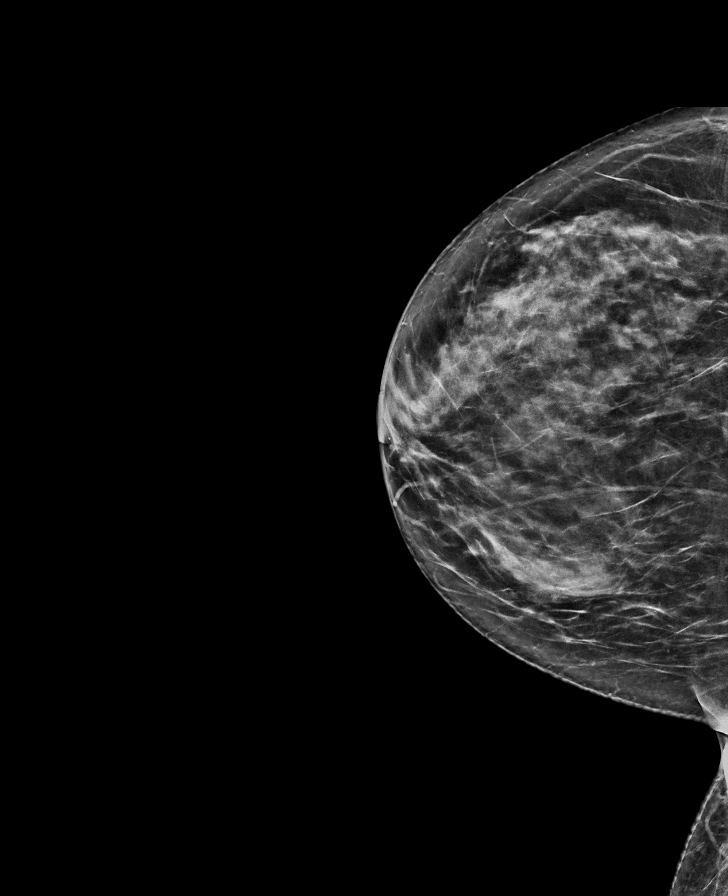

[L CC synth-2D]
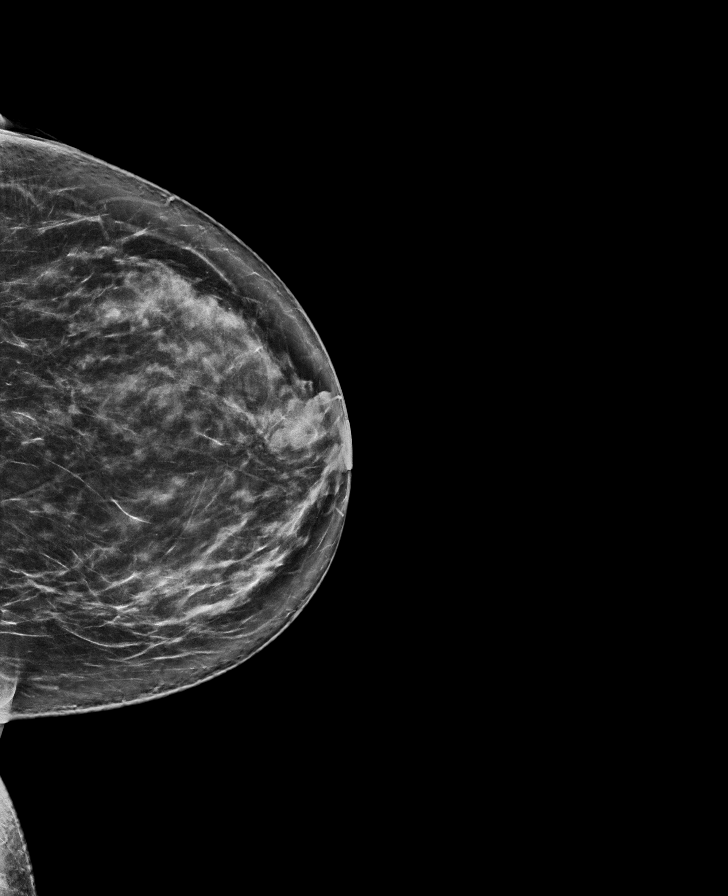

[L MLO synth-2D]
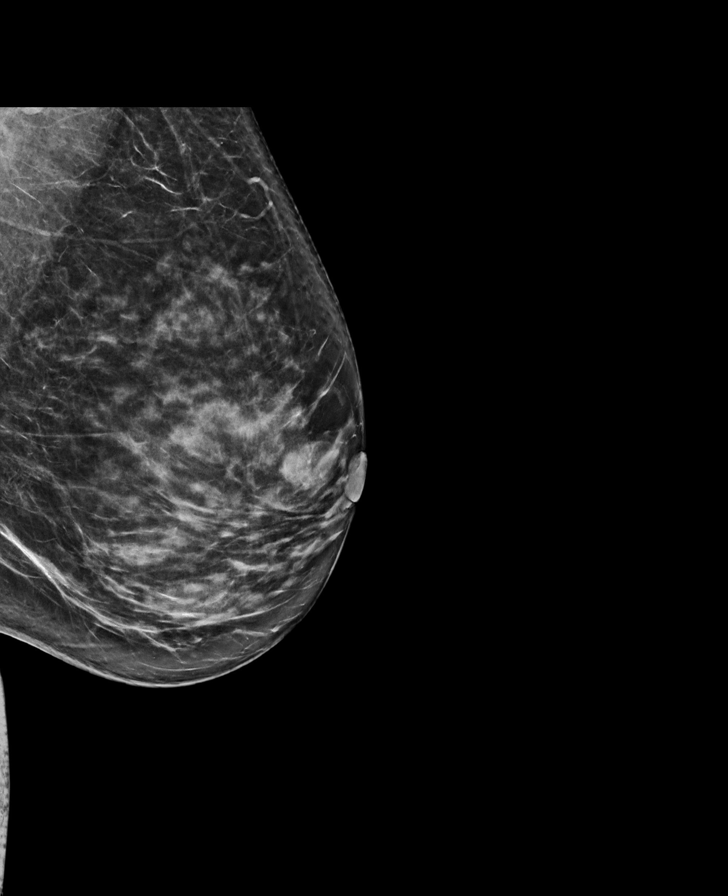

[R MLO synth-2D]
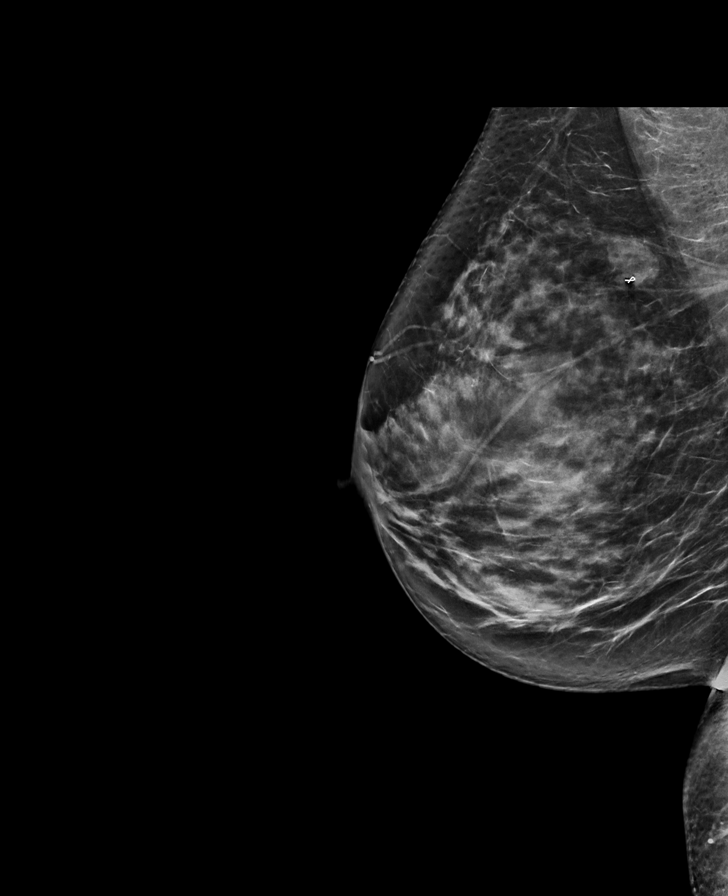

[R CC tomo · tomo slice 39/76.0]
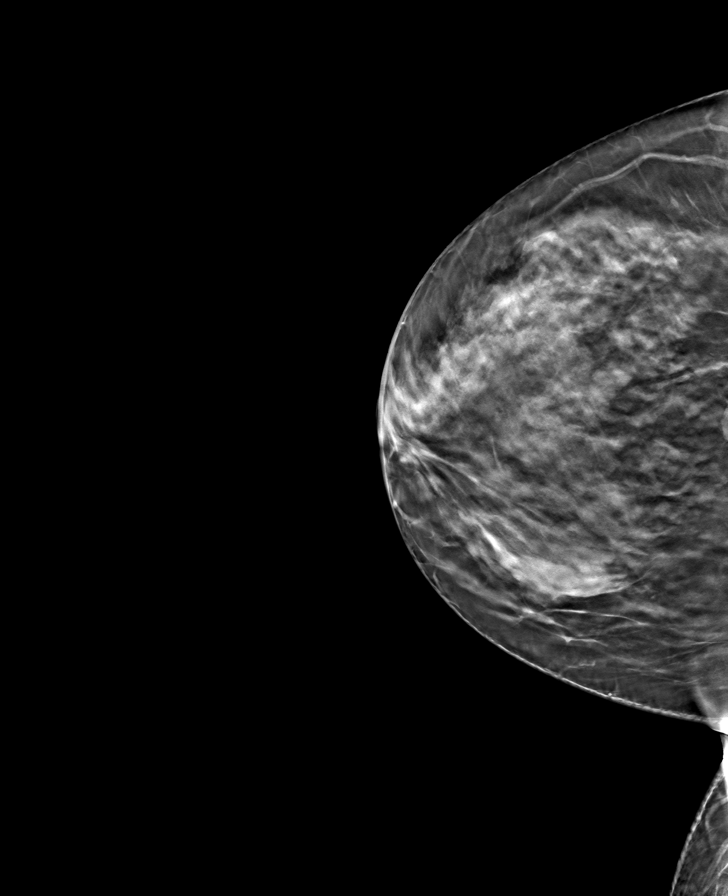

[R MLO tomo · tomo slice 41/82.0]
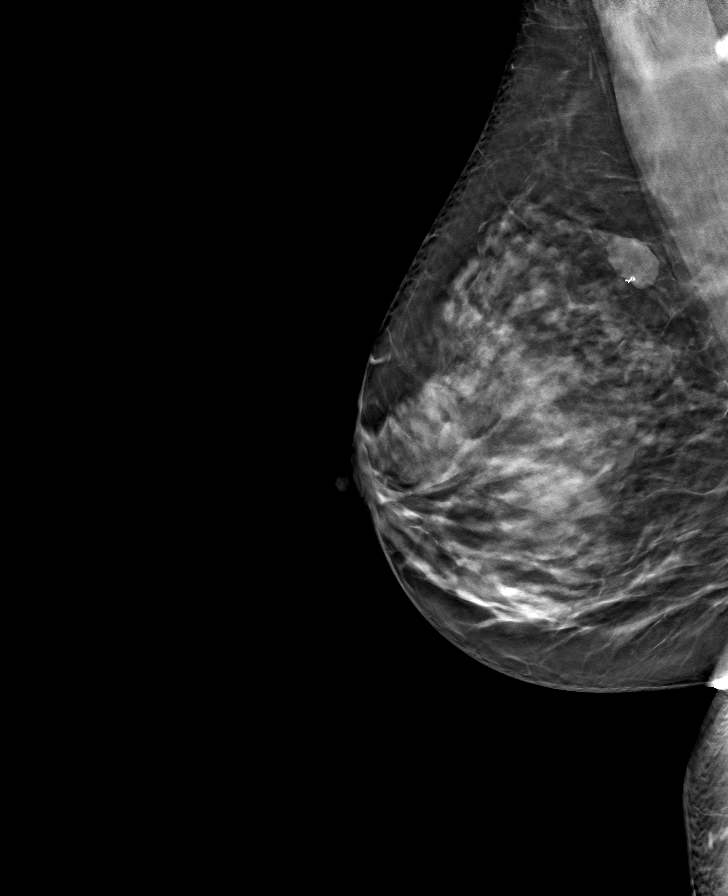

[L CC tomo · tomo slice 43/85.0]
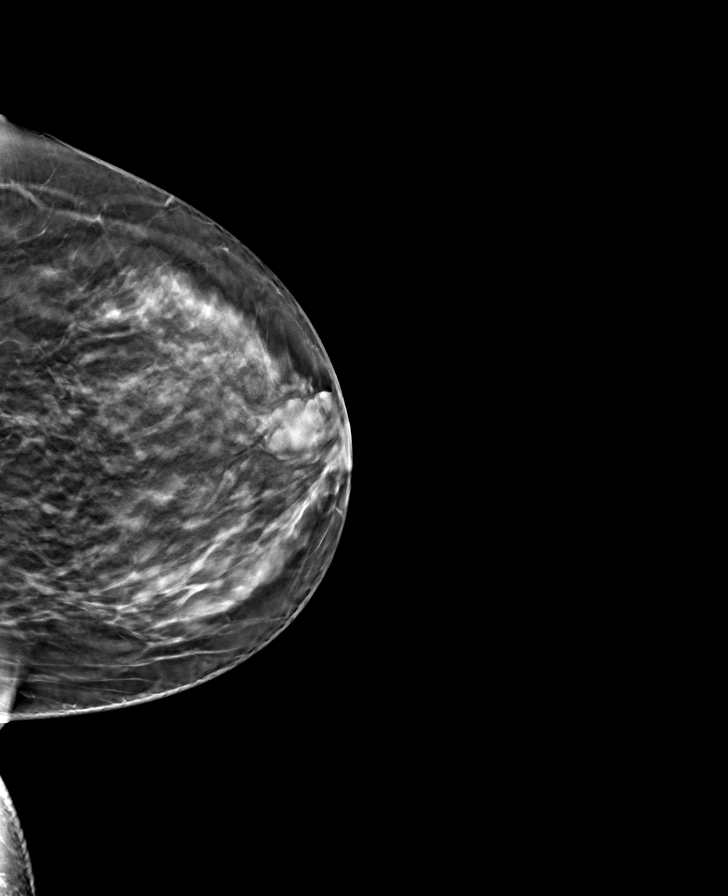

[L MLO tomo · tomo slice 43/84.0]
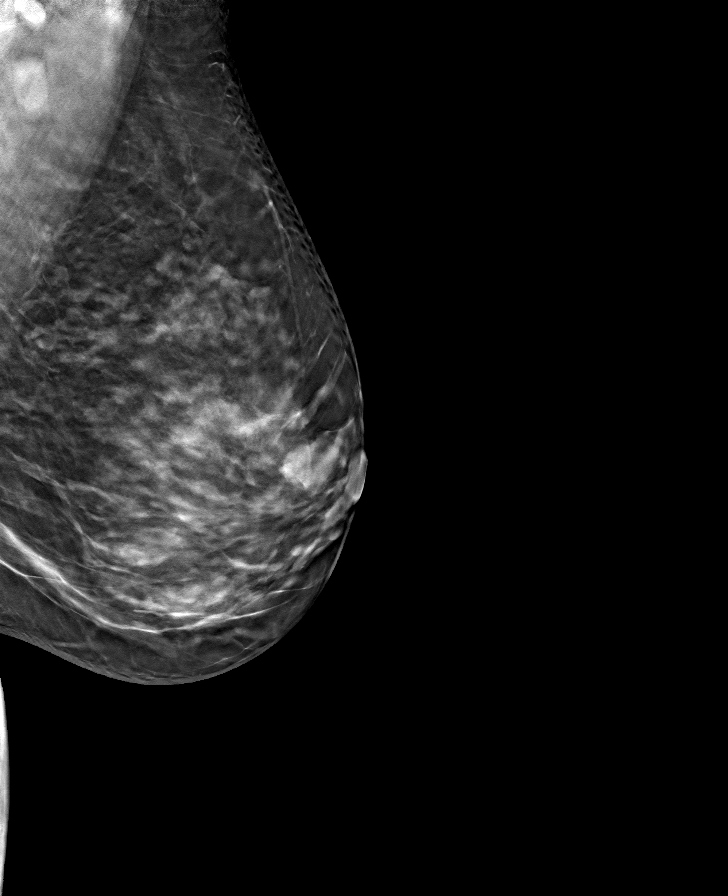

[8 of 24 positions shown; findings below may reference images not displayed]

ACR Breast Density Category c: The breast tissue is heterogeneously
dense, which may obscure small masses.
FINDINGS: There are no findings suspicious for malignancy.
IMPRESSION: No mammographic evidence of malignancy. A result letter of this
screening mammogram will be mailed directly to the patient.

RECOMMENDATION:
Screening mammogram in one year. (Code:Q3-W-BC3)

BI-RADS CATEGORY  1: Negative.
